# Patient Record
Sex: Female | Born: 1960 | Race: White | Hispanic: No | Marital: Married | Smoking: Former smoker
Health system: Southern US, Community
[De-identification: ages and names within clinical notes are randomized; demographics above are authoritative.]

## PROBLEM LIST (undated history)

## (undated) DIAGNOSIS — R739 Hyperglycemia, unspecified: Secondary | ICD-10-CM

## (undated) DIAGNOSIS — I341 Nonrheumatic mitral (valve) prolapse: Secondary | ICD-10-CM

## (undated) DIAGNOSIS — L409 Psoriasis, unspecified: Secondary | ICD-10-CM

## (undated) DIAGNOSIS — Z973 Presence of spectacles and contact lenses: Secondary | ICD-10-CM

## (undated) DIAGNOSIS — K589 Irritable bowel syndrome without diarrhea: Secondary | ICD-10-CM

## (undated) DIAGNOSIS — I10 Essential (primary) hypertension: Secondary | ICD-10-CM

## (undated) DIAGNOSIS — K5790 Diverticulosis of intestine, part unspecified, without perforation or abscess without bleeding: Secondary | ICD-10-CM

## (undated) DIAGNOSIS — K219 Gastro-esophageal reflux disease without esophagitis: Secondary | ICD-10-CM

## (undated) HISTORY — PX: CHOLECYSTECTOMY: SHX55

## (undated) HISTORY — PX: LAPAROSCOPIC TUBAL LIGATION: SUR803

---

## 2011-05-14 DIAGNOSIS — I10 Essential (primary) hypertension: Secondary | ICD-10-CM | POA: Insufficient documentation

## 2011-08-20 DIAGNOSIS — IMO0001 Reserved for inherently not codable concepts without codable children: Secondary | ICD-10-CM | POA: Insufficient documentation

## 2013-06-20 ENCOUNTER — Emergency Department: Payer: Self-pay | Admitting: Emergency Medicine

## 2013-06-20 LAB — COMPREHENSIVE METABOLIC PANEL
ALBUMIN: 4.1 g/dL (ref 3.4–5.0)
ALK PHOS: 132 U/L — AB
ANION GAP: 6 — AB (ref 7–16)
BUN: 18 mg/dL (ref 7–18)
Bilirubin,Total: 0.6 mg/dL (ref 0.2–1.0)
CO2: 28 mmol/L (ref 21–32)
Calcium, Total: 9.3 mg/dL (ref 8.5–10.1)
Chloride: 103 mmol/L (ref 98–107)
Creatinine: 0.91 mg/dL (ref 0.60–1.30)
EGFR (African American): >60
EGFR (Non-African Amer.): >60
Glucose: 118 mg/dL — ABNORMAL HIGH (ref 65–99)
Osmolality: 277 (ref 275–301)
Potassium: 3.7 mmol/L (ref 3.5–5.1)
SGOT(AST): 24 U/L (ref 15–37)
SGPT (ALT): 26 U/L (ref 12–78)
SODIUM: 137 mmol/L (ref 136–145)
TOTAL PROTEIN: 7.7 g/dL (ref 6.4–8.2)

## 2013-06-20 LAB — URINALYSIS, COMPLETE
Bilirubin,UR: NEGATIVE
Blood: NEGATIVE
Glucose,UR: NEGATIVE mg/dL (ref 0–75)
KETONE: NEGATIVE
Leukocyte Esterase: NEGATIVE
Nitrite: NEGATIVE
PH: 6 (ref 4.5–8.0)
Protein: NEGATIVE
SPECIFIC GRAVITY: 1.008 (ref 1.003–1.030)
Squamous Epithelial: 1
WBC UR: 1 /HPF (ref 0–5)

## 2013-06-20 LAB — CBC WITH DIFFERENTIAL/PLATELET
Basophil #: 0.1 10*3/uL (ref 0.0–0.1)
Basophil %: 0.5 %
EOS PCT: 1.7 %
Eosinophil #: 0.2 10*3/uL (ref 0.0–0.7)
HCT: 39.7 % (ref 35.0–47.0)
HGB: 13.3 g/dL (ref 12.0–16.0)
Lymphocyte #: 1.4 10*3/uL (ref 1.0–3.6)
Lymphocyte %: 13.4 %
MCH: 33.9 pg (ref 26.0–34.0)
MCHC: 33.5 g/dL (ref 32.0–36.0)
MCV: 101 fL — ABNORMAL HIGH (ref 80–100)
Monocyte #: 0.8 x10 3/mm (ref 0.2–0.9)
Monocyte %: 7.9 %
Neutrophil #: 8.2 10*3/uL — ABNORMAL HIGH (ref 1.4–6.5)
Neutrophil %: 76.5 %
Platelet: 175 10*3/uL (ref 150–440)
RBC: 3.93 10*6/uL (ref 3.80–5.20)
RDW: 13.1 % (ref 11.5–14.5)
WBC: 10.7 10*3/uL (ref 3.6–11.0)

## 2013-06-20 LAB — LIPASE, BLOOD: Lipase: 173 U/L (ref 73–393)

## 2013-06-20 LAB — TROPONIN I

## 2013-08-01 DIAGNOSIS — R87810 Cervical high risk human papillomavirus (HPV) DNA test positive: Secondary | ICD-10-CM | POA: Insufficient documentation

## 2013-08-01 DIAGNOSIS — IMO0002 Reserved for concepts with insufficient information to code with codable children: Secondary | ICD-10-CM | POA: Insufficient documentation

## 2013-08-04 DIAGNOSIS — M76899 Other specified enthesopathies of unspecified lower limb, excluding foot: Secondary | ICD-10-CM | POA: Insufficient documentation

## 2013-08-17 ENCOUNTER — Ambulatory Visit: Payer: Self-pay | Admitting: Surgery

## 2013-08-18 LAB — PATHOLOGY REPORT

## 2013-08-21 ENCOUNTER — Emergency Department: Payer: Self-pay | Admitting: Emergency Medicine

## 2013-08-21 LAB — COMPREHENSIVE METABOLIC PANEL
Albumin: 3.6 g/dL (ref 3.4–5.0)
Alkaline Phosphatase: 108 U/L
Anion Gap: 8 (ref 7–16)
BILIRUBIN TOTAL: 0.8 mg/dL (ref 0.2–1.0)
BUN: 15 mg/dL (ref 7–18)
CHLORIDE: 104 mmol/L (ref 98–107)
CO2: 27 mmol/L (ref 21–32)
Calcium, Total: 9.1 mg/dL (ref 8.5–10.1)
Creatinine: 0.66 mg/dL (ref 0.60–1.30)
EGFR (Non-African Amer.): >60
GLUCOSE: 141 mg/dL — AB (ref 65–99)
Osmolality: 281 (ref 275–301)
Potassium: 3.9 mmol/L (ref 3.5–5.1)
SGOT(AST): 27 U/L (ref 15–37)
SGPT (ALT): 43 U/L (ref 12–78)
Sodium: 139 mmol/L (ref 136–145)
TOTAL PROTEIN: 7 g/dL (ref 6.4–8.2)

## 2013-08-21 LAB — URINALYSIS, COMPLETE
BACTERIA: NONE SEEN
BILIRUBIN, UR: NEGATIVE
Glucose,UR: NEGATIVE mg/dL (ref 0–75)
Ketone: NEGATIVE
LEUKOCYTE ESTERASE: NEGATIVE
Nitrite: NEGATIVE
PH: 6 (ref 4.5–8.0)
Protein: NEGATIVE
RBC,UR: 64 /HPF (ref 0–5)
SPECIFIC GRAVITY: 1.014 (ref 1.003–1.030)
SQUAMOUS EPITHELIAL: NONE SEEN

## 2013-08-21 LAB — CBC WITH DIFFERENTIAL/PLATELET
Basophil #: 0 10*3/uL (ref 0.0–0.1)
Basophil %: 0.4 %
EOS PCT: 1.1 %
Eosinophil #: 0.1 10*3/uL (ref 0.0–0.7)
HCT: 44.1 % (ref 35.0–47.0)
HGB: 14.3 g/dL (ref 12.0–16.0)
LYMPHS PCT: 18.5 %
Lymphocyte #: 1.2 10*3/uL (ref 1.0–3.6)
MCH: 33.1 pg (ref 26.0–34.0)
MCHC: 32.4 g/dL (ref 32.0–36.0)
MCV: 102 fL — ABNORMAL HIGH (ref 80–100)
MONO ABS: 0.5 x10 3/mm (ref 0.2–0.9)
MONOS PCT: 8 %
Neutrophil #: 4.7 10*3/uL (ref 1.4–6.5)
Neutrophil %: 72 %
Platelet: 221 10*3/uL (ref 150–440)
RBC: 4.32 10*6/uL (ref 3.80–5.20)
RDW: 13.1 % (ref 11.5–14.5)
WBC: 6.6 10*3/uL (ref 3.6–11.0)

## 2013-08-21 LAB — PREGNANCY, URINE: Pregnancy Test, Urine: NEGATIVE m[IU]/mL

## 2014-07-08 NOTE — Op Note (Signed)
PATIENT NAME:  Tina Hurst, Tina Hurst MR#:  [id number] DATE OF BIRTH:  May 28, 1960  DATE OF PROCEDURE:  08/17/2013  ATTENDING SURGEON: Harrell Gave A. Veron Senner, MD  PREOPERATIVE DIAGNOSIS: Symptomatic cholelithiasis.   POSTOPERATIVE DIAGNOSIS: Symptomatic cholelithiasis.  PROCEDURE PERFORMED: Da Vinci robot-assisted laparoscopic cholecystectomy.   ESTIMATED BLOOD LOSS: 25 mL.   COMPLICATIONS: None.   SPECIMENS: Gallbladder.   INDICATION FOR SURGERY: [name] is a pleasant 54 year old female with gallstones and recurrent right upper quadrant pain which is biliary in nature. She is thus brought to the operating room for cholecystectomy.   DETAILS OF PROCEDURE: As follows: Informed consent was obtained. [name] was brought to the operating room suite. She was induced, endotracheal tube was placed, general anesthesia was administered. Her abdomen was then prepped and draped in standard surgical fashion. A timeout was then performed, correctly identifying the patient name, operative site and procedure to be performed. An infraumbilical lengthwise incision was made. This was deepened down to the fascia. The fascia was incised. The peritoneum was entered. Two stay sutures were placed through the fasciotomy. A balloon tipped Hasson trocar was placed through the fasciotomy. Two 8 mm da Vinci robot trocars were placed approximately 8 cm to the patient's left and anterior as well as to the patient's right and mildly anterior. A 5 mm assistant port was placed at the right anterior axillary line. The gallbladder was then lifted over the dome of the liver, and after robot was docked, the cystic artery and cystic duct were dissected out and clipped and ligated. There was a second structure that looked like a posterior artery, but may have been a band of fibrinous tissue, which was also clipped and ligated. The gallbladder was then taken off the gallbladder fossa using cautery. It was then brought out through an  Endo Catch bag through the infraumbilical port site. The wound was then irrigated. Everything was noted to be hemostatic. The robot was undocked, and all trocars were taken out. The wounds were infiltrated with 1% lidocaine with epinephrine. The infraumbilical fascia was closed with a figure-of-eight 0 Vicryl. All skin sites were closed with interrupted 4-0 Monocryl deep dermal sutures. Steri-Strips, Telfa gauze and Tegaderm were used to complete the wounds. The patient was then awoken, extubated and brought to the postanesthesia care unit. There were no immediate complications. Needle, sponge and instrument counts were correct at the end of the procedure.   ____________________________ Glena Norfolk. Niley Helbig, MD cal:lb D: 08/18/2013 10:34:50 ET T: 08/18/2013 11:12:42 ET JOB#: [id number]  cc: Harrell Gave A. Samanatha Brammer, MD, <Dictator> Floyde Parkins MD ELECTRONICALLY SIGNED 08/18/2013 18:06

## 2014-10-29 ENCOUNTER — Emergency Department: Payer: BLUE CROSS/BLUE SHIELD

## 2014-10-29 ENCOUNTER — Emergency Department
Admission: EM | Admit: 2014-10-29 | Discharge: 2014-10-29 | Disposition: A | Discharge status: Home / Self Care | Payer: BLUE CROSS/BLUE SHIELD | Source: Home / Self Care | Attending: Emergency Medicine | Admitting: Emergency Medicine

## 2014-10-29 ENCOUNTER — Encounter: Payer: Self-pay | Admitting: Radiology

## 2014-10-29 DIAGNOSIS — K5732 Diverticulitis of large intestine without perforation or abscess without bleeding: Secondary | ICD-10-CM

## 2014-10-29 DIAGNOSIS — K5792 Diverticulitis of intestine, part unspecified, without perforation or abscess without bleeding: Secondary | ICD-10-CM | POA: Diagnosis not present

## 2014-10-29 DIAGNOSIS — R1032 Left lower quadrant pain: Secondary | ICD-10-CM

## 2014-10-29 HISTORY — DX: Essential (primary) hypertension: I10

## 2014-10-29 LAB — COMPREHENSIVE METABOLIC PANEL
ALT: 17 U/L (ref 14–54)
AST: 22 U/L (ref 15–41)
Albumin: 4.2 g/dL (ref 3.5–5.0)
Alkaline Phosphatase: 97 U/L (ref 38–126)
Anion gap: 10 (ref 5–15)
BUN: 18 mg/dL (ref 6–20)
CALCIUM: 8.9 mg/dL (ref 8.9–10.3)
CHLORIDE: 100 mmol/L — AB (ref 101–111)
CO2: 27 mmol/L (ref 22–32)
Creatinine, Ser: 0.85 mg/dL (ref 0.44–1.00)
GFR calc Af Amer: >60 mL/min (ref >60)
GFR calc non Af Amer: >60 mL/min (ref >60)
Glucose, Bld: 134 mg/dL — ABNORMAL HIGH (ref 65–99)
Potassium: 3.6 mmol/L (ref 3.5–5.1)
SODIUM: 137 mmol/L (ref 135–145)
TOTAL PROTEIN: 7.4 g/dL (ref 6.5–8.1)
Total Bilirubin: 1 mg/dL (ref 0.3–1.2)

## 2014-10-29 LAB — CBC WITH DIFFERENTIAL/PLATELET
BASOS ABS: 0 10*3/uL (ref 0–0.1)
BASOS PCT: 0 %
EOS PCT: 2 %
Eosinophils Absolute: 0.2 10*3/uL (ref 0–0.7)
HCT: 39.3 % (ref 35.0–47.0)
Hemoglobin: 13.3 g/dL (ref 12.0–16.0)
LYMPHS PCT: 11 %
Lymphs Abs: 1.1 10*3/uL (ref 1.0–3.6)
MCH: 33.5 pg (ref 26.0–34.0)
MCHC: 33.8 g/dL (ref 32.0–36.0)
MCV: 99 fL (ref 80.0–100.0)
MONOS PCT: 9 %
Monocytes Absolute: 0.9 10*3/uL (ref 0.2–0.9)
NEUTROS PCT: 78 %
Neutro Abs: 7.7 10*3/uL — ABNORMAL HIGH (ref 1.4–6.5)
PLATELETS: 165 10*3/uL (ref 150–440)
RBC: 3.97 MIL/uL (ref 3.80–5.20)
RDW: 13 % (ref 11.5–14.5)
WBC: 9.9 10*3/uL (ref 3.6–11.0)

## 2014-10-29 LAB — URINALYSIS COMPLETE WITH MICROSCOPIC (ARMC ONLY)
BILIRUBIN URINE: NEGATIVE
GLUCOSE, UA: NEGATIVE mg/dL
HGB URINE DIPSTICK: NEGATIVE
Ketones, ur: NEGATIVE mg/dL
Leukocytes, UA: NEGATIVE
Nitrite: POSITIVE — AB
PROTEIN: NEGATIVE mg/dL
SPECIFIC GRAVITY, URINE: 1.017 (ref 1.005–1.030)
pH: 6 (ref 5.0–8.0)

## 2014-10-29 LAB — LIPASE, BLOOD: LIPASE: 61 U/L — AB (ref 22–51)

## 2014-10-29 MED ORDER — CIPROFLOXACIN HCL 500 MG PO TABS
500.0000 mg | ORAL_TABLET | Freq: Once | ORAL | Status: AC
  Administered 2014-10-29: 500 mg via ORAL
  Filled 2014-10-29: qty 1

## 2014-10-29 MED ORDER — METRONIDAZOLE 500 MG PO TABS
500.0000 mg | ORAL_TABLET | Freq: Once | ORAL | Status: AC
  Administered 2014-10-29: 500 mg via ORAL
  Filled 2014-10-29: qty 1

## 2014-10-29 MED ORDER — CIPROFLOXACIN HCL 500 MG PO TABS
500.0000 mg | ORAL_TABLET | Freq: Two times a day (BID) | ORAL | Status: AC
Start: 2014-10-29 — End: 2014-11-08

## 2014-10-29 MED ORDER — ONDANSETRON HCL 4 MG/2ML IJ SOLN
4.0000 mg | Freq: Once | INTRAMUSCULAR | Status: AC
  Administered 2014-10-29: 4 mg via INTRAVENOUS
  Filled 2014-10-29: qty 2

## 2014-10-29 MED ORDER — IOHEXOL 300 MG/ML  SOLN
100.0000 mL | Freq: Once | INTRAMUSCULAR | Status: AC | PRN
  Administered 2014-10-29: 100 mL via INTRAVENOUS

## 2014-10-29 MED ORDER — OXYCODONE-ACETAMINOPHEN 5-325 MG PO TABS: 1.0000 | ORAL_TABLET | Freq: Four times a day (QID) | ORAL | Status: DC | PRN

## 2014-10-29 MED ORDER — MORPHINE SULFATE 4 MG/ML IJ SOLN
4.0000 mg | Freq: Once | INTRAMUSCULAR | Status: AC
Start: 2014-10-29 — End: 2014-10-29
  Administered 2014-10-29: 4 mg via INTRAVENOUS
  Filled 2014-10-29: qty 1

## 2014-10-29 MED ORDER — IOHEXOL 240 MG/ML SOLN
25.0000 mL | Freq: Once | INTRAMUSCULAR | Status: AC | PRN
  Administered 2014-10-29: 25 mL via ORAL

## 2014-10-29 MED ORDER — OXYCODONE-ACETAMINOPHEN 5-325 MG PO TABS
2.0000 | ORAL_TABLET | Freq: Once | ORAL | Status: AC
  Administered 2014-10-29: 2 via ORAL
  Filled 2014-10-29: qty 2

## 2014-10-29 MED ORDER — METRONIDAZOLE 500 MG PO TABS: 500.0000 mg | ORAL_TABLET | Freq: Three times a day (TID) | ORAL | Status: DC

## 2014-10-29 NOTE — ED Provider Notes (Signed)
[facility] Emergency Department Provider Note     Time seen: ----------------------------------------- 7:03 AM on 10/29/2014 -----------------------------------------    I have reviewed the triage vital signs and the nursing notes.   HISTORY  Chief Complaint Abdominal Pain    HPI Tina Hurst is a 54 y.o. female who presents ER with acute left lower quadrant pain since yesterday. She states she's gotten progressively worse. She has not had a history of this before, movement makes it worse, did not have a bowel movement yesterday. Denies any nausea or vomiting or urinary symptoms. May have had a fever, but no chills   No past medical history on file.  There are no active problems to display for this patient.   No past surgical history on file.  Allergies Augmentin and Sulfa antibiotics  Social History Social History  Substance Use Topics  . Smoking status: Not on file  . Smokeless tobacco: Not on file  . Alcohol Use: Not on file    Review of Systems Constitutional: Positive for fever Eyes: Negative for visual changes. ENT: Negative for sore throat. Cardiovascular: Negative for chest pain. Respiratory: Negative for shortness of breath. Gastrointestinal: Positive for left lower quadrant pain Genitourinary: Negative for dysuria. Musculoskeletal: Negative for back pain. Skin: Negative for rash. Neurological: Negative for headaches, focal weakness or numbness.  10-point ROS otherwise negative.  ____________________________________________   PHYSICAL EXAM:  VITAL SIGNS: ED Triage Vitals  Enc Vitals Group     BP 10/29/14 0622 131/81 mmHg     Pulse Rate 10/29/14 0622 108     Resp 10/29/14 0622 20     Temp 10/29/14 0622 98.4 F (36.9 C)     Temp Source 10/29/14 0622 Oral     SpO2 10/29/14 0622 100 %     Weight 10/29/14 0622 230 lb (104.327 kg)     Height 10/29/14 0622 5\' 10"  (1.778 m)     Head Cir --      Peak Flow --    Pain Score 10/29/14 0623 2     Pain Loc --      Pain Edu? --      Excl. in [place]? --     Constitutional: Alert and oriented. Well appearing and in no distress. Eyes: Conjunctivae are normal. PERRL. Normal extraocular movements. ENT   Head: Normocephalic and atraumatic.   Nose: No congestion/rhinnorhea.   Mouth/Throat: Mucous membranes are moist.   Neck: No stridor. Cardiovascular: Normal rate, regular rhythm. Normal and symmetric distal pulses are present in all extremities. No murmurs, rubs, or gallops. Respiratory: Normal respiratory effort without tachypnea nor retractions. Breath sounds are clear and equal bilaterally. No wheezes/rales/rhonchi. Gastrointestinal: Significant left lower quadrant tenderness, no rebound or guarding. Normal bowel sounds. Musculoskeletal: Nontender with normal range of motion in all extremities. No joint effusions.  No lower extremity tenderness nor edema. Neurologic:  Normal speech and language. No gross focal neurologic deficits are appreciated. Speech is normal. No gait instability. Skin:  Skin is warm, dry and intact. No rash noted. Psychiatric: Mood and affect are normal. Speech and behavior are normal. Patient exhibits appropriate insight and judgment.  ____________________________________________  ED COURSE:  Pertinent labs & imaging results that were available during my care of the patient were reviewed by me and considered in my medical decision making (see chart for details). Patient will need imaging, likely diverticulitis. Morphine will be given for pain ____________________________________________    LABS (pertinent positives/negatives)  Labs Reviewed  CBC WITH DIFFERENTIAL/PLATELET - Abnormal; Notable  for the following:    Neutro Abs 7.7 (*)    All other components within normal limits  COMPREHENSIVE METABOLIC PANEL - Abnormal; Notable for the following:    Chloride 100 (*)    Glucose, Bld 134 (*)    All other components  within normal limits  LIPASE, BLOOD - Abnormal; Notable for the following:    Lipase 61 (*)    All other components within normal limits  URINALYSIS COMPLETEWITH MICROSCOPIC ([facility] ONLY) - Abnormal; Notable for the following:    Color, Urine YELLOW (*)    APPearance HAZY (*)    Nitrite POSITIVE (*)    Bacteria, UA RARE (*)    Squamous Epithelial / LPF 0-5 (*)    All other components within normal limits    RADIOLOGY Images were viewed by me  CT abdomen and pelvis with contrast IMPRESSION: 1. Findings, as above, compatible with acute diverticulitis of the descending colon. No signs of diverticular abscess or frank perforation at this time. 2. Given the extensive colonic wall thickening in this area, the possibility of concurrent neoplasm is not excluded. Although not strongly favored, followup nonemergent colonoscopy is recommended in the near future after resolution of the patient's acute diverticulitis to better evaluate this area and exclude the possibility of concurrent neoplasm. 3. Moderate to large hiatal hernia. 4. Atherosclerosis. 5. Additional incidental findings, as above. ____________________________________________  FINAL ASSESSMENT AND PLAN  Diverticulitis  Plan: Patient with labs and imaging as dictated above. Patient being treated here with Cipro and Flagyl for diverticulitis. Will be discharged with Cipro Flagyl and Percocet for pain. She is advised to follow-up with her doctor in next 2-3 days if not improving.   Earleen Newport, MD   Earleen Newport, MD 10/29/14 [phone]

## 2014-10-29 NOTE — ED Notes (Signed)
Reports left lower quad pain since Saturday that has gotten progressive worse.  Denies nausea, vomiting diarrhea or urinary symptoms.

## 2014-10-29 NOTE — Discharge Instructions (Signed)

## 2014-10-31 ENCOUNTER — Telehealth: Payer: Self-pay | Admitting: Gastroenterology

## 2014-10-31 NOTE — Telephone Encounter (Signed)
Patient ended up in the ED this week and has questions regarding medications. She was diagnosed with diverticulitis. Please call today.

## 2014-11-01 ENCOUNTER — Inpatient Hospital Stay
Admission: EM | Admit: 2014-11-01 | Discharge: 2014-11-03 | DRG: 392 | Disposition: A | Discharge status: Home / Self Care | Payer: BLUE CROSS/BLUE SHIELD | Attending: Internal Medicine | Admitting: Internal Medicine

## 2014-11-01 ENCOUNTER — Encounter: Payer: Self-pay | Admitting: *Deleted

## 2014-11-01 DIAGNOSIS — R739 Hyperglycemia, unspecified: Secondary | ICD-10-CM | POA: Diagnosis present

## 2014-11-01 DIAGNOSIS — Z9851 Tubal ligation status: Secondary | ICD-10-CM | POA: Diagnosis not present

## 2014-11-01 DIAGNOSIS — Z8249 Family history of ischemic heart disease and other diseases of the circulatory system: Secondary | ICD-10-CM | POA: Diagnosis not present

## 2014-11-01 DIAGNOSIS — Z888 Allergy status to other drugs, medicaments and biological substances status: Secondary | ICD-10-CM | POA: Diagnosis not present

## 2014-11-01 DIAGNOSIS — Z79899 Other long term (current) drug therapy: Secondary | ICD-10-CM

## 2014-11-01 DIAGNOSIS — Z833 Family history of diabetes mellitus: Secondary | ICD-10-CM

## 2014-11-01 DIAGNOSIS — E876 Hypokalemia: Secondary | ICD-10-CM | POA: Diagnosis present

## 2014-11-01 DIAGNOSIS — Z811 Family history of alcohol abuse and dependence: Secondary | ICD-10-CM | POA: Diagnosis not present

## 2014-11-01 DIAGNOSIS — Z79891 Long term (current) use of opiate analgesic: Secondary | ICD-10-CM | POA: Diagnosis not present

## 2014-11-01 DIAGNOSIS — K589 Irritable bowel syndrome without diarrhea: Secondary | ICD-10-CM | POA: Diagnosis present

## 2014-11-01 DIAGNOSIS — I10 Essential (primary) hypertension: Secondary | ICD-10-CM | POA: Diagnosis present

## 2014-11-01 DIAGNOSIS — Z87891 Personal history of nicotine dependence: Secondary | ICD-10-CM | POA: Diagnosis not present

## 2014-11-01 DIAGNOSIS — Z882 Allergy status to sulfonamides status: Secondary | ICD-10-CM

## 2014-11-01 DIAGNOSIS — K5792 Diverticulitis of intestine, part unspecified, without perforation or abscess without bleeding: Secondary | ICD-10-CM | POA: Diagnosis present

## 2014-11-01 DIAGNOSIS — K5732 Diverticulitis of large intestine without perforation or abscess without bleeding: Principal | ICD-10-CM | POA: Diagnosis present

## 2014-11-01 HISTORY — DX: Diverticulosis of intestine, part unspecified, without perforation or abscess without bleeding: K57.90

## 2014-11-01 HISTORY — DX: Hyperglycemia, unspecified: R73.9

## 2014-11-01 HISTORY — DX: Irritable bowel syndrome, unspecified: K58.9

## 2014-11-01 HISTORY — DX: Psoriasis, unspecified: L40.9

## 2014-11-01 HISTORY — DX: Nonrheumatic mitral (valve) prolapse: I34.1

## 2014-11-01 LAB — COMPREHENSIVE METABOLIC PANEL
ALBUMIN: 4.5 g/dL (ref 3.5–5.0)
ALT: 42 U/L (ref 14–54)
ANION GAP: 10 (ref 5–15)
AST: 51 U/L — ABNORMAL HIGH (ref 15–41)
Alkaline Phosphatase: 106 U/L (ref 38–126)
BUN: 12 mg/dL (ref 6–20)
CO2: 26 mmol/L (ref 22–32)
Calcium: 9.3 mg/dL (ref 8.9–10.3)
Chloride: 97 mmol/L — ABNORMAL LOW (ref 101–111)
Creatinine, Ser: 0.81 mg/dL (ref 0.44–1.00)
GFR calc Af Amer: >60 mL/min (ref >60)
GFR calc non Af Amer: >60 mL/min (ref >60)
GLUCOSE: 99 mg/dL (ref 65–99)
POTASSIUM: 3.5 mmol/L (ref 3.5–5.1)
SODIUM: 133 mmol/L — AB (ref 135–145)
TOTAL PROTEIN: 7.8 g/dL (ref 6.5–8.1)
Total Bilirubin: 0.5 mg/dL (ref 0.3–1.2)

## 2014-11-01 LAB — URINALYSIS COMPLETE WITH MICROSCOPIC (ARMC ONLY)
BILIRUBIN URINE: NEGATIVE
Glucose, UA: NEGATIVE mg/dL
HGB URINE DIPSTICK: NEGATIVE
Ketones, ur: NEGATIVE mg/dL
NITRITE: NEGATIVE
PH: 6 (ref 5.0–8.0)
Protein, ur: NEGATIVE mg/dL
SPECIFIC GRAVITY, URINE: 1.013 (ref 1.005–1.030)

## 2014-11-01 LAB — CBC WITH DIFFERENTIAL/PLATELET
BASOS PCT: 0 %
Basophils Absolute: 0 10*3/uL (ref 0–0.1)
EOS ABS: 0.2 10*3/uL (ref 0–0.7)
EOS PCT: 3 %
HCT: 42.9 % (ref 35.0–47.0)
Hemoglobin: 14.3 g/dL (ref 12.0–16.0)
Lymphocytes Relative: 23 %
Lymphs Abs: 1.5 10*3/uL (ref 1.0–3.6)
MCH: 33.2 pg (ref 26.0–34.0)
MCHC: 33.4 g/dL (ref 32.0–36.0)
MCV: 99.6 fL (ref 80.0–100.0)
MONO ABS: 0.7 10*3/uL (ref 0.2–0.9)
MONOS PCT: 11 %
Neutro Abs: 4.1 10*3/uL (ref 1.4–6.5)
Neutrophils Relative %: 63 %
Platelets: 256 10*3/uL (ref 150–440)
RBC: 4.31 MIL/uL (ref 3.80–5.20)
RDW: 12.8 % (ref 11.5–14.5)
WBC: 6.6 10*3/uL (ref 3.6–11.0)

## 2014-11-01 MED ORDER — HYDROMORPHONE HCL 1 MG/ML IJ SOLN
0.5000 mg | Freq: Once | INTRAMUSCULAR | Status: AC
  Administered 2014-11-02: 0.5 mg via INTRAVENOUS
  Filled 2014-11-01: qty 1

## 2014-11-01 MED ORDER — SODIUM CHLORIDE 0.9 % IV SOLN
INTRAVENOUS | Status: AC
  Administered 2014-11-01: 23:00:00 via INTRAVENOUS

## 2014-11-01 MED ORDER — PIPERACILLIN-TAZOBACTAM 3.375 G IVPB
3.3750 g | Freq: Once | INTRAVENOUS | Status: DC
  Administered 2014-11-01: 3.375 g via INTRAVENOUS

## 2014-11-01 MED ORDER — PIPERACILLIN-TAZOBACTAM 3.375 G IVPB
3.3750 g | Freq: Three times a day (TID) | INTRAVENOUS | Status: DC
  Administered 2014-11-02 – 2014-11-03 (×4): 3.375 g via INTRAVENOUS
  Filled 2014-11-01 (×9): qty 50

## 2014-11-01 MED ORDER — PIPERACILLIN SOD-TAZOBACTAM SO 3.375 (3-0.375) G IV SOLR
INTRAVENOUS | Status: AC
  Filled 2014-11-01: qty 3.38

## 2014-11-01 MED ORDER — SODIUM CHLORIDE 0.9 % IV BOLUS (SEPSIS)
1000.0000 mL | Freq: Once | INTRAVENOUS | Status: AC
Start: 2014-11-01 — End: 2014-11-01
  Administered 2014-11-01: 1000 mL via INTRAVENOUS

## 2014-11-01 MED ORDER — ONDANSETRON HCL 4 MG/2ML IJ SOLN
4.0000 mg | Freq: Four times a day (QID) | INTRAMUSCULAR | Status: DC | PRN
  Administered 2014-11-01: 4 mg via INTRAVENOUS
  Filled 2014-11-01: qty 2

## 2014-11-01 MED ORDER — PIPERACILLIN-TAZOBACTAM 3.375 G IVPB
3.3750 g | Freq: Three times a day (TID) | INTRAVENOUS | Status: DC
  Filled 2014-11-01 (×2): qty 50

## 2014-11-01 MED ORDER — ENOXAPARIN SODIUM 40 MG/0.4ML ~~LOC~~ SOLN
40.0000 mg | SUBCUTANEOUS | Status: DC
  Administered 2014-11-02 – 2014-11-03 (×2): 40 mg via SUBCUTANEOUS
  Filled 2014-11-01 (×2): qty 0.4

## 2014-11-01 MED ORDER — MORPHINE SULFATE (PF) 4 MG/ML IV SOLN
4.0000 mg | INTRAVENOUS | Status: DC | PRN
  Administered 2014-11-01 – 2014-11-02 (×2): 4 mg via INTRAVENOUS
  Filled 2014-11-01 (×2): qty 1

## 2014-11-01 NOTE — Telephone Encounter (Signed)
Called patient once again. No answer. Left voicemail for return phone call. Will be glad to speak with patient if she calls back in.

## 2014-11-01 NOTE — Progress Notes (Signed)
ANTIBIOTIC CONSULT NOTE - INITIAL  Pharmacy Consult for Zosyn Indication: IAI  Allergies  Allergen Reactions  . Augmentin [Amoxicillin-Pot Clavulanate] Nausea And Vomiting and Other (See Comments)    Pt states that she is able to take 250mg  tablet.    . Amlodipine Swelling and Palpitations  . Sulfa Antibiotics Rash    Patient Measurements: Height: 5\' 10"  (177.8 cm) Weight: 230 lb (104.327 kg) IBW/kg (Calculated) : 68.5 Adjusted Body Weight: 82 kg  Vital Signs: Temp: 98 F (36.7 C) (08/17 1741) Temp Source: Oral (08/17 1741) BP: 140/69 mmHg (08/17 2241) Pulse Rate: 75 (08/17 2241) Intake/Output from previous day:   Intake/Output from this shift:    Labs:  Recent Labs  11/01/14 1745  WBC 6.6  HGB 14.3  PLT 256  CREATININE 0.81   Estimated Creatinine Clearance: 101.4 mL/min (by C-G formula based on Cr of 0.81). No results for input(s): VANCOTROUGH, VANCOPEAK, VANCORANDOM, GENTTROUGH, GENTPEAK, GENTRANDOM, TOBRATROUGH, TOBRAPEAK, TOBRARND, AMIKACINPEAK, AMIKACINTROU, AMIKACIN in the last 72 hours.   Microbiology: No results found for this or any previous visit (from the past 720 hour(s)).  Medical History: Past Medical History  Diagnosis Date  . Hypertension   . IBS (irritable bowel syndrome)   . Psoriasis   . Diverticulosis   . Mitral valve prolapse   . Hyperglycemia     Medications:  Infusions:  . sodium chloride     Assessment: 54 yof cc abdominal pain recently took Flagyl/Cipro x 3 days for diverticulitis. Continues to have pain/no improvement so represents to ED and starting Zosyn.  Goal of Therapy:  Resolution of abdominal pain, normalization of imaging  Plan:  Expected duration 7 days with resolution of temperature and/or normalization of WBC. Zosyn 3.375 gm IV Q8H EI.   Laural Benes, Pharm.D.  Clinical Pharmacist 11/01/2014,11:00 PM

## 2014-11-01 NOTE — H&P (Signed)
[facility] [place] NAME: Tina Hurst    MR#:  [id number]  DATE OF BIRTH:  20-Apr-1960  DATE OF ADMISSION:  11/01/2014  PRIMARY CARE PHYSICIAN: Hortencia Pilar, MD   REQUESTING/REFERRING PHYSICIAN: Archie Balboa, M.D.  CHIEF COMPLAINT:   Chief Complaint  Patient presents with  . Abdominal Pain    HISTORY OF PRESENT ILLNESS:  Tina Hurst  is a 54 y.o. female who presents with left lower abdominal pain with nausea and vomiting. This initially started 4-5 days ago, with left lower quadrant pain, and then diarrhea and nausea. The pain progressed and is oftentimes severe. Patient was seen for this 3 days ago and was diagnosed with diverticulitis, and was placed on outpatient oral antibiotics. Despite taking his oral antibiotics, her condition is not improved. She denies any blood in her stool. CT of her abdomen tonight in the ED is consistent with diverticulitis. Hospitalists were called for admission for IV antibiotics for diverticulitis in the setting of having failed outpatient therapy.  PAST MEDICAL HISTORY:   Past Medical History  Diagnosis Date  . Hypertension   . IBS (irritable bowel syndrome)   . Psoriasis   . Diverticulosis   . Mitral valve prolapse   . Hyperglycemia     PAST SURGICAL HISTORY:   Past Surgical History  Procedure Laterality Date  . Laparoscopic tubal ligation      SOCIAL HISTORY:   Social History  Substance Use Topics  . Smoking status: Former Research scientist (life sciences)  . Smokeless tobacco: Not on file  . Alcohol Use: 0.0 oz/week    0 Standard drinks or equivalent per week     Comment: occassional - 4 drinks a week    FAMILY HISTORY:   Family History  Problem Relation Age of Onset  . Irritable bowel syndrome Sister   . Hypertension Mother   . Diabetes Mother   . Hypertension Father   . Alcohol abuse Father     DRUG ALLERGIES:   Allergies  Allergen Reactions  . Augmentin [Amoxicillin-Pot Clavulanate]  Nausea And Vomiting and Other (See Comments)    Pt states that she is able to take 250mg  tablet.    . Amlodipine Swelling and Palpitations  . Sulfa Antibiotics Rash    MEDICATIONS AT HOME:   Prior to Admission medications   Medication Sig Start Date End Date Taking? Authorizing Provider  alosetron (LOTRONEX) 0.5 MG tablet Take 0.25 mg by mouth daily.   Yes Historical Provider, MD  Calcium Carb-Cholecalciferol (CALCIUM 600 + D PO) Take 1 tablet by mouth daily.    Yes Historical Provider, MD  carvedilol (COREG) 25 MG tablet Take 25 mg by mouth 2 (two) times daily.   Yes Historical Provider, MD  Chromium Picolinate (CHROMIUM PICOLATE) 1000 MCG TABS Take 1,000 mcg by mouth daily.   Yes Historical Provider, MD  ciprofloxacin (CIPRO) 500 MG tablet Take 1 tablet (500 mg total) by mouth 2 (two) times daily. 10/29/14 11/08/14 Yes Earleen Newport, MD  hydrochlorothiazide (HYDRODIURIL) 12.5 MG tablet Take 12.5 mg by mouth daily.   Yes Historical Provider, MD  losartan (COZAAR) 100 MG tablet Take 100 mg by mouth daily.   Yes Historical Provider, MD  metroNIDAZOLE (FLAGYL) 500 MG tablet Take 1 tablet (500 mg total) by mouth 3 (three) times daily. 10/29/14  Yes Earleen Newport, MD  Multiple Vitamin (MULTIVITAMIN WITH MINERALS) TABS tablet Take 1 tablet by mouth daily.   Yes Historical Provider, MD  oxyCODONE-acetaminophen (ROXICET) 5-325  MG per tablet Take 1 tablet by mouth every 6 (six) hours as needed. Patient taking differently: Take 1 tablet by mouth every 6 (six) hours as needed for severe pain.  10/29/14  Yes Earleen Newport, MD  PRESCRIPTION MEDICATION Take 1 capsule by mouth daily. Pt takes a compounded estrogen capsule.  Biestrogen (80/20) and Progesterone 0.4mg /50mg .   Yes Historical Provider, MD    REVIEW OF SYSTEMS:  Review of Systems  Constitutional: Positive for fever (subjective, at home) and malaise/fatigue. Negative for chills and weight loss.  HENT: Negative for ear pain,  hearing loss and tinnitus.   Eyes: Negative for blurred vision, double vision, pain and redness.  Respiratory: Negative for cough, hemoptysis and shortness of breath.   Cardiovascular: Negative for chest pain, palpitations, orthopnea and leg swelling.  Gastrointestinal: Positive for nausea, abdominal pain and diarrhea. Negative for vomiting, constipation and blood in stool.  Genitourinary: Negative for dysuria, frequency and hematuria.  Musculoskeletal: Negative for back pain, joint pain and neck pain.  Skin:       No acne, rash, or lesions  Neurological: Negative for dizziness, tremors, focal weakness and weakness.  Endo/Heme/Allergies: Negative for polydipsia. Does not bruise/bleed easily.  Psychiatric/Behavioral: Negative for depression. The patient is not nervous/anxious and does not have insomnia.      VITAL SIGNS:   Filed Vitals:   11/01/14 1741  BP: 102/81  Pulse: 96  Temp: 98 F (36.7 C)  TempSrc: Oral  Resp: 20  Height: 5\' 10"  (1.778 m)  Weight: 104.327 kg (230 lb)  SpO2: 99%   Wt Readings from Last 3 Encounters:  11/01/14 104.327 kg (230 lb)  10/29/14 104.327 kg (230 lb)    PHYSICAL EXAMINATION:  Physical Exam  Vitals reviewed. Constitutional: She is oriented to person, place, and time. She appears well-developed and well-nourished. No distress.  HENT:  Head: Normocephalic and atraumatic.  Mouth/Throat: Oropharynx is clear and moist.  Eyes: Conjunctivae and EOM are normal. Pupils are equal, round, and reactive to light. No scleral icterus.  Neck: Normal range of motion. Neck supple. No JVD present. No thyromegaly present.  Cardiovascular: Normal rate, regular rhythm and intact distal pulses.  Exam reveals no gallop and no friction rub.   No murmur heard. Respiratory: Effort normal and breath sounds normal. No respiratory distress. She has no wheezes. She has no rales.  GI: Soft. Bowel sounds are normal. She exhibits no distension. There is tenderness (Worst left  lower quadrant).  Musculoskeletal: Normal range of motion. She exhibits no edema.  No arthritis, no gout  Lymphadenopathy:    She has no cervical adenopathy.  Neurological: She is alert and oriented to person, place, and time. No cranial nerve deficit.  No dysarthria, no aphasia  Skin: Skin is warm and dry. No rash noted. No erythema.  Psychiatric: She has a normal mood and affect. Her behavior is normal. Judgment and thought content normal.    LABORATORY PANEL:   CBC  Recent Labs Lab 11/01/14 1745  WBC 6.6  HGB 14.3  HCT 42.9  PLT 256   ------------------------------------------------------------------------------------------------------------------  Chemistries   Recent Labs Lab 11/01/14 1745  NA 133*  K 3.5  CL 97*  CO2 26  GLUCOSE 99  BUN 12  CREATININE 0.81  CALCIUM 9.3  AST 51*  ALT 42  ALKPHOS 106  BILITOT 0.5   ------------------------------------------------------------------------------------------------------------------  Cardiac Enzymes No results for input(s): TROPONINI in the last 168 hours. ------------------------------------------------------------------------------------------------------------------  RADIOLOGY:  No results found.  EKG:   Orders  placed or performed in visit on 06/20/13  . EKG 12-Lead    IMPRESSION AND PLAN:  Principal Problem:   Diverticulitis - patient placed on Zosyn in the ED, we will continue this medication while inpatient. Plan to scale her back to oral antibiotics once her pain and symptoms improve. Gentle IV fluids for now, keep her nothing by mouth for now. Active Problems:   HTN (hypertension) - hold home medications for this right now, as her blood pressure is not elevated.   IBS (irritable bowel syndrome) - continue home medications   Hyperglycemia - glucose has been stable so far, we will start with every 12 hours fingersticks, to be discontinued if they remain normal  All the records are reviewed and  case discussed with ED provider. Management plans discussed with the patient and/or family.  DVT PROPHYLAXIS: SubQ lovenox  ADMISSION STATUS: Inpatient  CODE STATUS: Full  TOTAL TIME TAKING CARE OF THIS PATIENT: 45 minutes.    Jaylon Grode FIELDING 11/01/2014, 9:55 PM  [facility]  Office  [phone]  CC: Primary care physician; Hortencia Pilar, MD

## 2014-11-01 NOTE — Telephone Encounter (Signed)
Returned phone call to patient at this time. No answer. Left voicemail for patient to return phone call.

## 2014-11-01 NOTE — ED Notes (Signed)
Pt has left lower abd pain.  Pt sent from pmd for eval.  Pt was seen in er 3 days and was dx with diverticulitis.  Pt states pain is no better.

## 2014-11-01 NOTE — ED Notes (Signed)
Pt here with LLQ pain, pt was here on Sunday and seen for the same. Pt was given Po med's and sent home. Pt f/u with her PCP today and was not getting any better, PCP sent pt in for re-eval. Pt denies N/V. Pt states that she has not been able to eat or drink a lot.  Pt in NAD at this time will cont to monitor pt at all times.

## 2014-11-01 NOTE — ED Provider Notes (Signed)
[facility] Emergency Department Provider Note   ____________________________________________  Time seen: 1955  I have reviewed the triage vital signs and the nursing notes.   HISTORY  Chief Complaint Abdominal Pain   History limited by: Not Limited   HPI Tina Hurst is a 54 y.o. female who presents to the emergency department today because of continued abdominal pain. The patient was seen in the emergency department 3 days ago for similar abdominal pain and was diagnosed diverticulitis on CT scan. Patient states she has been taking Flagyl and Cipro for 3 days. States she feels like it has not improved her pain at all. She went to see her primary care doctor today for a follow-up and the recommended she get reevaluated here in the emergency department.   Past Medical History  Diagnosis Date  . Hypertension     There are no active problems to display for this patient.   No past surgical history on file.  Current Outpatient Rx  Name  Route  Sig  Dispense  Refill  . Calcium Carb-Cholecalciferol (CALCIUM 600 + D PO)   Oral   Take 1 tablet by mouth daily.          . carvedilol (COREG) 25 MG tablet   Oral   Take 25 mg by mouth 2 (two) times daily.         . Chromium Picolinate (CHROMIUM PICOLATE) 1000 MCG TABS   Oral   Take 1,000 mcg by mouth daily.         . ciprofloxacin (CIPRO) 500 MG tablet   Oral   Take 1 tablet (500 mg total) by mouth 2 (two) times daily.   20 tablet   0   . hydrochlorothiazide (HYDRODIURIL) 12.5 MG tablet   Oral   Take 12.5 mg by mouth daily.         Marland Kitchen losartan (COZAAR) 100 MG tablet   Oral   Take 100 mg by mouth daily.         . metroNIDAZOLE (FLAGYL) 500 MG tablet   Oral   Take 1 tablet (500 mg total) by mouth 3 (three) times daily.   30 tablet   0   . Multiple Vitamin (MULTIVITAMIN) tablet   Oral   Take 1 tablet by mouth daily.         Marland Kitchen oxyCODONE-acetaminophen (ROXICET) 5-325 MG per  tablet   Oral   Take 1 tablet by mouth every 6 (six) hours as needed.   20 tablet   0   . PRESCRIPTION MEDICATION   Oral   Take 1 capsule by mouth daily. Biestrogen (80/20) Progesterone 0.4mg /50mg .           Allergies Augmentin; Amlodipine; and Sulfa antibiotics  No family history on file.  Social History Social History  Substance Use Topics  . Smoking status: Former Research scientist (life sciences)  . Smokeless tobacco: Not on file  . Alcohol Use: Yes    Review of Systems  Constitutional: Negative for fever. Cardiovascular: Negative for chest pain. Respiratory: Negative for shortness of breath. Gastrointestinal: Positive for abdominal pain, vomiting and diarrhea. Genitourinary: Negative for dysuria. Musculoskeletal: Negative for back pain. Skin: Negative for rash. Neurological: Negative for headaches, focal weakness or numbness.  10-point ROS otherwise negative.  ____________________________________________   PHYSICAL EXAM:  VITAL SIGNS: ED Triage Vitals  Enc Vitals Group     BP 11/01/14 1741 102/81 mmHg     Pulse Rate 11/01/14 1741 96     Resp 11/01/14 1741 20  Temp 11/01/14 1741 98 F (36.7 C)     Temp Source 11/01/14 1741 Oral     SpO2 11/01/14 1741 99 %     Weight 11/01/14 1741 230 lb (104.327 kg)     Height 11/01/14 1741 5\' 10"  (1.778 m)     Head Cir --      Peak Flow --      Pain Score 11/01/14 1743 8   Constitutional: Alert and oriented. Well appearing and in no distress. Eyes: Conjunctivae are normal. PERRL. Normal extraocular movements. ENT   Head: Normocephalic and atraumatic.   Nose: No congestion/rhinnorhea.   Mouth/Throat: Mucous membranes are moist.   Neck: No stridor. Hematological/Lymphatic/Immunilogical: No cervical lymphadenopathy. Cardiovascular: Normal rate, regular rhythm.  No murmurs, rubs, or gallops. Respiratory: Normal respiratory effort without tachypnea nor retractions. Breath sounds are clear and equal bilaterally. No  wheezes/rales/rhonchi. Gastrointestinal: Soft, tender to palpation diffusely. Worse tenderness in the left lower quadrant. No distention. Genitourinary: Deferred Musculoskeletal: Normal range of motion in all extremities. No joint effusions.  No lower extremity tenderness nor edema. Neurologic:  Normal speech and language. No gross focal neurologic deficits are appreciated. Speech is normal.  Skin:  Skin is warm, dry and intact. No rash noted. Psychiatric: Mood and affect are normal. Speech and behavior are normal. Patient exhibits appropriate insight and judgment.  ____________________________________________    LABS (pertinent positives/negatives)  Labs Reviewed  COMPREHENSIVE METABOLIC PANEL - Abnormal; Notable for the following:    Sodium 133 (*)    Chloride 97 (*)    AST 51 (*)    All other components within normal limits  URINALYSIS COMPLETEWITH MICROSCOPIC ([facility] ONLY) - Abnormal; Notable for the following:    Color, Urine YELLOW (*)    APPearance CLEAR (*)    Leukocytes, UA 1+ (*)    Bacteria, UA RARE (*)    Squamous Epithelial / LPF 0-5 (*)    All other components within normal limits  CBC WITH DIFFERENTIAL/PLATELET     ____________________________________________   EKG  None  ____________________________________________    RADIOLOGY  None  ____________________________________________   PROCEDURES  Procedure(s) performed: None  Critical Care performed: No  ____________________________________________   INITIAL IMPRESSION / ASSESSMENT AND PLAN / ED COURSE  Pertinent labs & imaging results that were available during my care of the patient were reviewed by me and considered in my medical decision making (see chart for details).  Patient presented to the emergency department today with continued abdominal pain. This was despite her being on outpatient and bilateral for roughly 3 days. Blood work without any leukocytosis. Additionally patient afebrile.  I do not feel that emergent reimaging is required at this time. However, given that the patient continues to have pain it appears that outpatient therapy has not been sufficient. Will plan on admitting for IV antibiotics, pain control and IV fluid hydration.  ____________________________________________   FINAL CLINICAL IMPRESSION(S) / ED DIAGNOSES  Final diagnoses:  Diverticulitis of intestine without perforation or abscess without bleeding     Nance Pear, MD 11/01/14 2159

## 2014-11-02 LAB — CBC
HCT: 35.7 % (ref 35.0–47.0)
Hemoglobin: 12.1 g/dL (ref 12.0–16.0)
MCH: 33.9 pg (ref 26.0–34.0)
MCHC: 33.9 g/dL (ref 32.0–36.0)
MCV: 100.1 fL — ABNORMAL HIGH (ref 80.0–100.0)
Platelets: 177 K/uL (ref 150–440)
RBC: 3.57 MIL/uL — ABNORMAL LOW (ref 3.80–5.20)
RDW: 12.9 % (ref 11.5–14.5)
WBC: 4.5 K/uL (ref 3.6–11.0)

## 2014-11-02 LAB — BASIC METABOLIC PANEL
Anion gap: 6 (ref 5–15)
BUN: 10 mg/dL (ref 6–20)
CO2: 25 mmol/L (ref 22–32)
CREATININE: 0.73 mg/dL (ref 0.44–1.00)
Calcium: 8.2 mg/dL — ABNORMAL LOW (ref 8.9–10.3)
Chloride: 109 mmol/L (ref 101–111)
Glucose, Bld: 111 mg/dL — ABNORMAL HIGH (ref 65–99)
POTASSIUM: 3.1 mmol/L — AB (ref 3.5–5.1)
SODIUM: 140 mmol/L (ref 135–145)

## 2014-11-02 LAB — MAGNESIUM: Magnesium: 1.9 mg/dL (ref 1.7–2.4)

## 2014-11-02 MED ORDER — SODIUM CHLORIDE 0.9 % IV SOLN
INTRAVENOUS | Status: DC
  Administered 2014-11-02 – 2014-11-03 (×2): via INTRAVENOUS

## 2014-11-02 NOTE — Progress Notes (Signed)
[place] [facility] [place] NAME: Tina Hurst    MR#:  [id number]  DATE OF BIRTH:  07-Aug-1960  SUBJECTIVE:  CHIEF COMPLAINT:   Chief Complaint  Patient presents with  . Abdominal Pain   Abdominal pain is better, no nausea,  vomiting or diarrhea. REVIEW OF SYSTEMS:  CONSTITUTIONAL: No fever, fatigue or weakness.  EYES: No blurred or double vision.  EARS, NOSE, AND THROAT: No tinnitus or ear pain.  RESPIRATORY: No cough, shortness of breath, wheezing or hemoptysis.  CARDIOVASCULAR: No chest pain, orthopnea, edema.  GASTROINTESTINAL: No nausea, vomiting, diarrhea has mild left lower quadrant abdominal pain.  GENITOURINARY: No dysuria, hematuria.  ENDOCRINE: No polyuria, nocturia,  HEMATOLOGY: No anemia, easy bruising or bleeding SKIN: No rash or lesion. MUSCULOSKELETAL: No joint pain or arthritis.   NEUROLOGIC: No tingling, numbness, weakness.  PSYCHIATRY: No anxiety or depression.   DRUG ALLERGIES:   Allergies  Allergen Reactions  . Augmentin [Amoxicillin-Pot Clavulanate] Nausea And Vomiting and Other (See Comments)    Pt states that she is able to take 250mg  tablet.    . Amlodipine Swelling and Palpitations  . Sulfa Antibiotics Rash    VITALS:  Blood pressure 135/79, pulse 68, temperature 98.4 F (36.9 C), temperature source Oral, resp. rate 18, height 5\' 10"  (1.778 m), weight 105.507 kg (232 lb 9.6 oz), SpO2 100 %.  PHYSICAL EXAMINATION:  GENERAL:  54 y.o.-year-old patient lying in the bed with no acute distress.  EYES: Pupils equal, round, reactive to light and accommodation. No scleral icterus. Extraocular muscles intact.  HEENT: Head atraumatic, normocephalic. Oropharynx and nasopharynx clear.  NECK:  Supple, no jugular venous distention. No thyroid enlargement, no tenderness.  LUNGS: Normal breath sounds bilaterally, no wheezing, rales,rhonchi or crepitation. No use of accessory muscles of respiration.   CARDIOVASCULAR: S1, S2 normal. No murmurs, rubs, or gallops.  ABDOMEN: Soft, tenderness in left lower quantity area, nondistended. Bowel sounds present. No organomegaly or mass.  EXTREMITIES: No pedal edema, cyanosis, or clubbing.  NEUROLOGIC: Cranial nerves II through XII are intact. Muscle strength 5/5 in all extremities. Sensation intact. Gait not checked.  PSYCHIATRIC: The patient is alert and oriented x 3.  SKIN: No obvious rash, lesion, or ulcer.    LABORATORY PANEL:   CBC  Recent Labs Lab 11/02/14 0621  WBC 4.5  HGB 12.1  HCT 35.7  PLT 177   ------------------------------------------------------------------------------------------------------------------  Chemistries   Recent Labs Lab 11/01/14 1745 11/02/14 0621 11/02/14 1424  NA 133* 140  --   K 3.5 3.1*  --   CL 97* 109  --   CO2 26 25  --   GLUCOSE 99 111*  --   BUN 12 10  --   CREATININE 0.81 0.73  --   CALCIUM 9.3 8.2*  --   MG  --   --  1.9  AST 51*  --   --   ALT 42  --   --   ALKPHOS 106  --   --   BILITOT 0.5  --   --    ------------------------------------------------------------------------------------------------------------------  Cardiac Enzymes No results for input(s): TROPONINI in the last 168 hours. ------------------------------------------------------------------------------------------------------------------  RADIOLOGY:  No results found.  EKG:   Orders placed or performed in visit on 06/20/13  . EKG 12-Lead    ASSESSMENT AND PLAN:   Diverticulitis - continue Zosyn and start full liquid diet.  HTN (hypertension) - hold home medications for this right now, as her blood pressure  is not elevated.  IBS (irritable bowel syndrome) - continue home medications  Hyperglycemia - glucose has been stable so far.   Hypokalemia, given potassium supplement and follow-up BMP. Magnesium level is normal     All the records are reviewed and case discussed with Care Management/Social  Workerr. Management plans discussed with the patient, family and they are in agreement.  CODE STATUS: Full code  TOTAL TIME TAKING CARE OF THIS PATIENT: 36 minutes.   POSSIBLE D/C IN 2 DAYS, DEPENDING ON CLINICAL CONDITION.   Demetrios Loll M.D on 11/02/2014 at 4:31 PM  Between 7am to 6pm - Pager - [phone]  After 6pm go to [email or web address] - password [email or web address] [place] [facility]  Office  [phone]  CC: Primary care physician; Hortencia Pilar, MD

## 2014-11-03 LAB — BASIC METABOLIC PANEL
Anion gap: 7 (ref 5–15)
BUN: 6 mg/dL (ref 6–20)
CHLORIDE: 111 mmol/L (ref 101–111)
CO2: 24 mmol/L (ref 22–32)
Calcium: 8.2 mg/dL — ABNORMAL LOW (ref 8.9–10.3)
Creatinine, Ser: 0.69 mg/dL (ref 0.44–1.00)
GFR calc Af Amer: >60 mL/min (ref >60)
GLUCOSE: 104 mg/dL — AB (ref 65–99)
POTASSIUM: 3.3 mmol/L — AB (ref 3.5–5.1)
Sodium: 142 mmol/L (ref 135–145)

## 2014-11-03 LAB — GLUCOSE, CAPILLARY: Glucose-Capillary: 101 mg/dL — ABNORMAL HIGH (ref 65–99)

## 2014-11-03 MED ORDER — POTASSIUM CHLORIDE CRYS ER 20 MEQ PO TBCR
40.0000 meq | EXTENDED_RELEASE_TABLET | Freq: Once | ORAL | Status: AC
  Administered 2014-11-03: 40 meq via ORAL
  Filled 2014-11-03: qty 2

## 2014-11-03 NOTE — Discharge Summary (Signed)
[place] [facility] [place] NAME: Tina Hurst    MR#:  [id number]  DATE OF BIRTH:  Sep 14, 1960  DATE OF ADMISSION:  11/01/2014 ADMITTING PHYSICIAN: Lance Coon, MD  DATE OF DISCHARGE: 11/03/2014 12:27 PM  PRIMARY CARE PHYSICIAN: Hortencia Pilar, MD    ADMISSION DIAGNOSIS:  Diverticulitis of intestine without perforation or abscess without bleeding [K57.92]   DISCHARGE DIAGNOSIS:  Acute diverticulitis  SECONDARY DIAGNOSIS:   Past Medical History  Diagnosis Date  . Hypertension   . IBS (irritable bowel syndrome)   . Psoriasis   . Diverticulosis   . Mitral valve prolapse   . Hyperglycemia     HOSPITAL COURSE:   Acute diverticulitis. The patient was treated with Cipro and the Flagyl for 3 days as outpatient without improvement. She was admitted for acute diverticulitis and started with the Zosyn. She tolerated diet and has no complaints. She needs to resume her Cipro and Flagyl after discharge.  HTN (hypertension) - her hypertension medication was on hold due to low sacral pressure. Blood pressure is better and elevated. She will resume home hypertension medication.   IBS (irritable bowel syndrome) - continue home medications  Hyperglycemia - glucose has been stable so far.   Hypokalemia, she was given potassium supplement and follow-up BMP as outpatient. Magnesium level is normal  DISCHARGE CONDITIONS:   The patient is clinically stable and discharged to home today.  CONSULTS OBTAINED:     DRUG ALLERGIES:   Allergies  Allergen Reactions  . Augmentin [Amoxicillin-Pot Clavulanate] Nausea And Vomiting and Other (See Comments)    Pt states that she is able to take 250mg  tablet.    . Amlodipine Swelling and Palpitations  . Sulfa Antibiotics Rash    DISCHARGE MEDICATIONS:   Discharge Medication List as of 11/03/2014 10:47 AM    CONTINUE these medications which have NOT CHANGED   Details  alosetron (LOTRONEX) 0.5 MG  tablet Take 0.25 mg by mouth daily., Until Discontinued, Historical Med    Calcium Carb-Cholecalciferol (CALCIUM 600 + D PO) Take 1 tablet by mouth daily. , Until Discontinued, Historical Med    carvedilol (COREG) 25 MG tablet Take 25 mg by mouth 2 (two) times daily., Until Discontinued, Historical Med    Chromium Picolinate (CHROMIUM PICOLATE) 1000 MCG TABS Take 1,000 mcg by mouth daily., Until Discontinued, Historical Med    ciprofloxacin (CIPRO) 500 MG tablet Take 1 tablet (500 mg total) by mouth 2 (two) times daily., Starting 10/29/2014, Until Wed 11/08/14, Print    hydrochlorothiazide (HYDRODIURIL) 12.5 MG tablet Take 12.5 mg by mouth daily., Until Discontinued, Historical Med    losartan (COZAAR) 100 MG tablet Take 100 mg by mouth daily., Until Discontinued, Historical Med    metroNIDAZOLE (FLAGYL) 500 MG tablet Take 1 tablet (500 mg total) by mouth 3 (three) times daily., Starting 10/29/2014, Until Discontinued, Print    Multiple Vitamin (MULTIVITAMIN WITH MINERALS) TABS tablet Take 1 tablet by mouth daily., Until Discontinued, Historical Med    oxyCODONE-acetaminophen (ROXICET) 5-325 MG per tablet Take 1 tablet by mouth every 6 (six) hours as needed., Starting 10/29/2014, Until Discontinued, Print    PRESCRIPTION MEDICATION Take 1 capsule by mouth daily. Pt takes a compounded estrogen capsule.  Biestrogen (80/20) and Progesterone 0.4mg /50mg ., Until Discontinued, Historical Med         DISCHARGE INSTRUCTIONS:    If you experience worsening of your admission symptoms, develop shortness of breath, life threatening emergency, suicidal or homicidal thoughts you must seek  medical attention immediately by calling 911 or calling your MD immediately  if symptoms less severe.  You Must read complete instructions/literature along with all the possible adverse reactions/side effects for all the Medicines you take and that have been prescribed to you. Take any new Medicines after you have  completely understood and accept all the possible adverse reactions/side effects.   Please note  You were cared for by a hospitalist during your hospital stay. If you have any questions about your discharge medications or the care you received while you were in the hospital after you are discharged, you can call the unit and asked to speak with the hospitalist on call if the hospitalist that took care of you is not available. Once you are discharged, your primary care physician will handle any further medical issues. Please note that NO REFILLS for any discharge medications will be authorized once you are discharged, as it is imperative that you return to your primary care physician (or establish a relationship with a primary care physician if you do not have one) for your aftercare needs so that they can reassess your need for medications and monitor your lab values.    Today   SUBJECTIVE    No complaint  VITAL SIGNS:  Blood pressure 148/76, pulse 73, temperature 97.9 F (36.6 C), temperature source Oral, resp. rate 19, height 5\' 10"  (1.778 m), weight 105.507 kg (232 lb 9.6 oz), SpO2 99 %.  I/O:   Intake/Output Summary (Last 24 hours) at 11/03/14 1342 Last data filed at 11/03/14 1009  Gross per 24 hour  Intake   1485 ml  Output    850 ml  Net    635 ml    PHYSICAL EXAMINATION:  GENERAL:  54 y.o.-year-old patient lying in the bed with no acute distress.  EYES: Pupils equal, round, reactive to light and accommodation. No scleral icterus. Extraocular muscles intact.  HEENT: Head atraumatic, normocephalic. Oropharynx and nasopharynx clear.  NECK:  Supple, no jugular venous distention. No thyroid enlargement, no tenderness.  LUNGS: Normal breath sounds bilaterally, no wheezing, rales,rhonchi or crepitation. No use of accessory muscles of respiration.  CARDIOVASCULAR: S1, S2 normal. No murmurs, rubs, or gallops.  ABDOMEN: Soft, mild tenderness on the left lower quandra area,  non-distended. Bowel sounds present. No organomegaly or mass.  EXTREMITIES: No pedal edema, cyanosis, or clubbing.  NEUROLOGIC: Cranial nerves II through XII are intact. Muscle strength 5/5 in all extremities. Sensation intact. Gait not checked.  PSYCHIATRIC: The patient is alert and oriented x 3.  SKIN: No obvious rash, lesion, or ulcer.   DATA REVIEW:   CBC  Recent Labs Lab 11/02/14 0621  WBC 4.5  HGB 12.1  HCT 35.7  PLT 177    Chemistries   Recent Labs Lab 11/01/14 1745  11/02/14 1424 11/03/14 0414  NA 133*  < >  --  142  K 3.5  < >  --  3.3*  CL 97*  < >  --  111  CO2 26  < >  --  24  GLUCOSE 99  < >  --  104*  BUN 12  < >  --  6  CREATININE 0.81  < >  --  0.69  CALCIUM 9.3  < >  --  8.2*  MG  --   --  1.9  --   AST 51*  --   --   --   ALT 42  --   --   --  ALKPHOS 106  --   --   --   BILITOT 0.5  --   --   --   < > = values in this interval not displayed.  Cardiac Enzymes No results for input(s): TROPONINI in the last 168 hours.  Microbiology Results  No results found for this or any previous visit.  RADIOLOGY:  No results found.      Management plans discussed with the patient, family and they are in agreement.  CODE STATUS:     Code Status Orders        Start     Ordered   11/01/14 2242  Full code   Continuous     11/01/14 2241      TOTAL TIME TAKING CARE OF THIS PATIENT: 32 minutes.    Demetrios Loll M.D on 11/03/2014 at 1:42 PM  Between 7am to 6pm - Pager - [phone]  After 6pm go to [email or web address] - password [email or web address] [place] [facility]  Office  [phone]  CC: Primary care physician; Hortencia Pilar, MD

## 2014-11-03 NOTE — Discharge Instructions (Signed)
Diverticulitis Diverticulitis is when small pockets that have formed in your colon (large intestine) become infected or swollen. HOME CARE  Follow your doctor's instructions.  Follow a special diet if told by your doctor.  When you feel better, your doctor may tell you to change your diet. You may be told to eat a lot of fiber. Fruits and vegetables are good sources of fiber. Fiber makes it easier to poop (have bowel movements).  Take supplements or probiotics as told by your doctor.  Only take medicines as told by your doctor.  Keep all follow-up visits with your doctor. GET HELP IF:  Your pain does not get better.  You have a hard time eating food.  You are not pooping like normal. GET HELP RIGHT AWAY IF:  Your pain gets worse.  Your problems do not get better.  Your problems suddenly get worse.  You have a fever.  You keep throwing up (vomiting).  You have bloody or black, tarry poop (stool). MAKE SURE YOU:   Understand these instructions.  Will watch your condition.  Will get help right away if you are not doing well or get worse. Document Released: 08/20/2007 Document Revised: 03/08/2013 Document Reviewed: 01/26/2013 [facility] Patient Information 2015 [place], [place]. This information is not intended to replace advice given to you by your health care provider. Make sure you discuss any questions you have with your health care provider.  Diverticulitis Diverticulitis is inflammation or infection of small pouches in your colon that form when you have a condition called diverticulosis. The pouches in your colon are called diverticula. Your colon, or large intestine, is where water is absorbed and stool is formed. Complications of diverticulitis can include:  Bleeding.  Severe infection.  Severe pain.  Perforation of your colon.  Obstruction of your colon. CAUSES  Diverticulitis is caused by bacteria. Diverticulitis happens when stool becomes trapped in  diverticula. This allows bacteria to grow in the diverticula, which can lead to inflammation and infection. RISK FACTORS People with diverticulosis are at risk for diverticulitis. Eating a diet that does not include enough fiber from fruits and vegetables may make diverticulitis more likely to develop. SYMPTOMS  Symptoms of diverticulitis may include:  Abdominal pain and tenderness. The pain is normally located on the left side of the abdomen, but may occur in other areas.  Fever and chills.  Bloating.  Cramping.  Nausea.  Vomiting.  Constipation.  Diarrhea.  Blood in your stool. DIAGNOSIS  Your health care provider will ask you about your medical history and do a physical exam. You may need to have tests done because many medical conditions can cause the same symptoms as diverticulitis. Tests may include:  Blood tests.  Urine tests.  Imaging tests of the abdomen, including X-rays and CT scans. When your condition is under control, your health care provider may recommend that you have a colonoscopy. A colonoscopy can show how severe your diverticula are and whether something else is causing your symptoms. TREATMENT  Most cases of diverticulitis are mild and can be treated at home. Treatment may include:  Taking over-the-counter pain medicines.  Following a clear liquid diet.  Taking antibiotic medicines by mouth for 7-10 days. More severe cases may be treated at a hospital. Treatment may include:  Not eating or drinking.  Taking prescription pain medicine.  Receiving antibiotic medicines through an IV tube.  Receiving fluids and nutrition through an IV tube.  Surgery. HOME CARE INSTRUCTIONS   Follow your health care provider's  instructions carefully.  Follow a full liquid diet or other diet as directed by your health care provider. After your symptoms improve, your health care provider may tell you to change your diet. He or she may recommend you eat a  high-fiber diet. Fruits and vegetables are good sources of fiber. Fiber makes it easier to pass stool.  Take fiber supplements or probiotics as directed by your health care provider.  Only take medicines as directed by your health care provider.  Keep all your follow-up appointments. SEEK MEDICAL CARE IF:   Your pain does not improve.  You have a hard time eating food.  Your bowel movements do not return to normal. SEEK IMMEDIATE MEDICAL CARE IF:   Your pain becomes worse.  Your symptoms do not get better.  Your symptoms suddenly get worse.  You have a fever.  You have repeated vomiting.  You have bloody or black, tarry stools. MAKE SURE YOU:   Understand these instructions.  Will watch your condition.  Will get help right away if you are not doing well or get worse. Document Released: 12/11/2004 Document Revised: 03/08/2013 Document Reviewed: 01/26/2013 [facility] Patient Information 2015 [place], [place]. This information is not intended to replace advice given to you by your health care provider. Make sure you discuss any questions you have with your health care provider.  Low sodium and low fat diet. Activity as tolerated.

## 2014-11-03 NOTE — Progress Notes (Signed)
ANTIBIOTIC CONSULT NOTE - FOLLOW UP  Pharmacy Consult for Zosyn Indication: IAI  Allergies  Allergen Reactions  . Augmentin [Amoxicillin-Pot Clavulanate] Nausea And Vomiting and Other (See Comments)    Pt states that she is able to take 250mg  tablet.    . Amlodipine Swelling and Palpitations  . Sulfa Antibiotics Rash    Patient Measurements: Height: 5\' 10"  (177.8 cm) Weight: 232 lb 9.6 oz (105.507 kg) IBW/kg (Calculated) : 68.5 Adjusted Body Weight: 82 kg  Vital Signs: Temp: 97.9 F (36.6 C) (08/19 0754) Temp Source: Oral (08/19 0754) BP: 148/76 mmHg (08/19 0754) Pulse Rate: 73 (08/19 0754) Intake/Output from previous day: 08/18 0701 - 08/19 0700 In: 1365 [P.O.:180; I.V.:1104; IV Piggyback:81] Out: 650 [Urine:650] Intake/Output from this shift: Total I/O In: 120 [P.O.:120] Out: 200 [Urine:200]  Labs:  Recent Labs  11/01/14 1745 11/02/14 0621 11/03/14 0414  WBC 6.6 4.5  --   HGB 14.3 12.1  --   PLT 256 177  --   CREATININE 0.81 0.73 0.69   Estimated Creatinine Clearance: 103.3 mL/min (by C-G formula based on Cr of 0.69). No results for input(s): VANCOTROUGH, VANCOPEAK, VANCORANDOM, GENTTROUGH, GENTPEAK, GENTRANDOM, TOBRATROUGH, TOBRAPEAK, TOBRARND, AMIKACINPEAK, AMIKACINTROU, AMIKACIN in the last 72 hours.   Microbiology: No results found for this or any previous visit (from the past 720 hour(s)).  Medical History: Past Medical History  Diagnosis Date  . Hypertension   . IBS (irritable bowel syndrome)   . Psoriasis   . Diverticulosis   . Mitral valve prolapse   . Hyperglycemia     Medications:  Infusions:  . sodium chloride 75 mL/hr at 11/03/14 0208   Assessment: 54 yof cc abdominal pain recently took Flagyl/Cipro x 3 days for diverticulitis. Continues to have pain/no improvement so represents to ED and starting Zosyn.  Goal of Therapy:  Resolution of abdominal pain, normalization of imaging  Plan:  Expected duration 7 days with resolution of  temperature and/or normalization of WBC. Zosyn 3.375 gm IV Q8H EI.   Alondria Mousseau D, Pharm.D.  Clinical Pharmacist 11/03/2014,10:55 AM

## 2014-11-23 ENCOUNTER — Ambulatory Visit (INDEPENDENT_AMBULATORY_CARE_PROVIDER_SITE_OTHER): Payer: BLUE CROSS/BLUE SHIELD | Admitting: Gastroenterology

## 2014-11-23 ENCOUNTER — Other Ambulatory Visit: Payer: Self-pay

## 2014-11-23 ENCOUNTER — Encounter: Payer: Self-pay | Admitting: Gastroenterology

## 2014-11-23 VITALS — BP 137/71 | HR 70 | Temp 98.0°F | Ht 70.5 in | Wt 234.0 lb

## 2014-11-23 DIAGNOSIS — R197 Diarrhea, unspecified: Secondary | ICD-10-CM

## 2014-11-23 MED ORDER — ALOSETRON HCL 0.5 MG PO TABS: 0.2500 mg | ORAL_TABLET | Freq: Every day | ORAL | Status: DC

## 2014-11-23 MED ORDER — ALOSETRON HCL 0.5 MG PO TABS
0.5000 mg | ORAL_TABLET | Freq: Two times a day (BID) | ORAL | Status: DC
Start: 2014-11-23 — End: 2015-11-30

## 2014-11-23 NOTE — Progress Notes (Signed)
Gastroenterology Consultation  Referring Provider:     Hortencia Pilar, MD Primary Care Physician:  Hortencia Pilar, MD Primary Gastroenterologist:  Dr. Allen Norris     Reason for Consultation:     diverticulitis        HPI:   Tina Hurst is a 54 y.o. y/o female referred for consultation & management of colitis by Dr. Hoy Morn, Ishmael Holter, MD.  This patient reports that she had diverticulitis at the beginning of August. The patient was in the hospital for IV antibiotics after by mouth antibiotics did not resolve her issues. The patient states that she never had diverticulitis in the past and this was the first episode. The patient did have a colonoscopy in 2011 with an adenomatous polyp found at that time. That was in [facility]. The patient states she is no longer having any pain and her bowel movements have not gone back to normal since being on all the antibiotics. She also reports that she has not lost any weight nor is she having any nausea vomiting present time.  Past Medical History  Diagnosis Date  . Hypertension   . IBS (irritable bowel syndrome)   . Psoriasis   . Diverticulosis   . Mitral valve prolapse   . Hyperglycemia     Past Surgical History  Procedure Laterality Date  . Laparoscopic tubal ligation      Prior to Admission medications   Medication Sig Start Date End Date Taking? Authorizing Provider  alosetron (LOTRONEX) 0.5 MG tablet Take 1 tablet (0.5 mg total) by mouth 2 (two) times daily. 11/23/14   Lucilla Lame, MD  Calcium Carb-Cholecalciferol (CALCIUM 600 + D PO) Take 1 tablet by mouth daily.     Historical Provider, MD  carvedilol (COREG) 25 MG tablet Take 25 mg by mouth 2 (two) times daily.    Historical Provider, MD  Chromium Picolinate (CHROMIUM PICOLATE) 1000 MCG TABS Take 1,000 mcg by mouth daily.    Historical Provider, MD  hydrochlorothiazide (HYDRODIURIL) 12.5 MG tablet Take 12.5 mg by mouth daily.    Historical Provider, MD  losartan (COZAAR) 100 MG  tablet Take 100 mg by mouth daily.    Historical Provider, MD  metroNIDAZOLE (FLAGYL) 500 MG tablet Take 1 tablet (500 mg total) by mouth 3 (three) times daily. 10/29/14   Earleen Newport, MD  Multiple Vitamin (MULTIVITAMIN WITH MINERALS) TABS tablet Take 1 tablet by mouth daily.    Historical Provider, MD  oxyCODONE-acetaminophen (ROXICET) 5-325 MG per tablet Take 1 tablet by mouth every 6 (six) hours as needed. Patient taking differently: Take 1 tablet by mouth every 6 (six) hours as needed for severe pain.  10/29/14   Earleen Newport, MD  PRESCRIPTION MEDICATION Take 1 capsule by mouth daily. Pt takes a compounded estrogen capsule.  Biestrogen (80/20) and Progesterone 0.4mg /50mg .    Historical Provider, MD    Family History  Problem Relation Age of Onset  . Irritable bowel syndrome Sister   . Hypertension Mother   . Diabetes Mother   . Hypertension Father   . Alcohol abuse Father      Social History  Substance Use Topics  . Smoking status: Former Research scientist (life sciences)  . Smokeless tobacco: Never Used  . Alcohol Use: 0.0 oz/week    0 Standard drinks or equivalent per week     Comment: occassional - 4 drinks a week    Allergies as of 11/23/2014 - Review Complete 11/01/2014  Allergen Reaction Noted  . Augmentin [amoxicillin-pot clavulanate]  Nausea And Vomiting and Other (See Comments) 10/29/2014  . Amlodipine Swelling and Palpitations 10/29/2014  . Sulfa antibiotics Rash 10/29/2014    Review of Systems:    All systems reviewed and negative except where noted in HPI.   Physical Exam:  BP 137/71 mmHg  Pulse 70  Temp(Src) 98 F (36.7 C) (Oral)  Ht 5' 10.5" (1.791 m)  Wt 234 lb (106.142 kg)  BMI 33.09 kg/m2 No LMP recorded. Patient is postmenopausal. Psych:  Alert and cooperative. Normal mood and affect. General:   Alert,  Well-developed, well-nourished, obese,pleasant and cooperative in NAD Head:  Normocephalic and atraumatic. Eyes:  Sclera clear, no icterus.   Conjunctiva  pink. Ears:  Normal auditory acuity. Nose:  No deformity, discharge, or lesions. Mouth:  No deformity or lesions,oropharynx pink & moist. Neck:  Supple; no masses or thyromegaly. Lungs:  Respirations even and unlabored.  Clear throughout to auscultation.   No wheezes, crackles, or rhonchi. No acute distress. Heart:  Regular rate and rhythm; no murmurs, clicks, rubs, or gallops. Abdomen:  Normal bowel sounds.  No bruits.  Soft, non-tender and non-distended without masses, hepatosplenomegaly or hernias noted.  No guarding or rebound tenderness.  Negative Carnett sign.   Rectal:  Deferred.  Msk:  Symmetrical without gross deformities.  Good, equal movement & strength bilaterally. Pulses:  Normal pulses noted. Extremities:  No clubbing or edema.  No cyanosis. Neurologic:  Alert and oriented x3;  grossly normal neurologically. Skin:  Intact without significant lesions or rashes.  No jaundice. Lymph Nodes:  No significant cervical adenopathy. Psych:  Alert and cooperative. Normal mood and affect.  Imaging Studies: Ct Abdomen Pelvis W Contrast  10/29/2014   CLINICAL DATA:  54 year old female with left lower quadrant abdominal pain for the past 24 hours, progressively worsening.  EXAM: CT ABDOMEN AND PELVIS WITH CONTRAST  TECHNIQUE: Multidetector CT imaging of the abdomen and pelvis was performed using the standard protocol following bolus administration of intravenous contrast.  CONTRAST:  161mL OMNIPAQUE IOHEXOL 300 MG/ML  SOLN  COMPARISON:  No priors.  FINDINGS: Lower chest:  Moderate to large hiatal hernia.  Hepatobiliary: Sub cm low-attenuation lesion in the central aspect of segment 4A of the liver is incompletely characterized, but statistically likely a small cyst. 1.1 x 0.7 cm intermediate attenuation (35 HU) lesion in segment 4B of the liver adjacent to the gallbladder fossa (image 24 of series 2) is indeterminate, but favored to represent a proteinaceous cyst. No other overtly suspicious  hepatic lesions are noted. Status post cholecystectomy. No intrahepatic biliary ductal dilatation. Common bile duct is mildly dilated measuring 9 mm in the porta hepatis, likely reflective of post cholecystectomy physiology.  Pancreas: No pancreatic mass. No pancreatic ductal dilatation. No pancreatic or peripancreatic inflammatory changes.  Spleen: Unremarkable.  Adrenals/Urinary Tract: Bilateral adrenal glands are normal in appearance. Multifocal cortical thinning in the kidneys bilaterally, suggestive of areas of chronic post infectious or inflammatory scarring. 1 cm low-attenuation lesion in the medial aspect of the lower pole the right kidney is compatible with a simple cyst. No hydroureteronephrosis or perinephric stranding to indicate urinary tract obstruction at this time. The urinary bladder is largely decompressed, but otherwise unremarkable in appearance.  Stomach/Bowel: The intra abdominal portion of the stomach is normal. No pathologic dilatation of small bowel or colon. Numerous colonic diverticulae are noted, and in the descending colon there is focal mural thickening in extensive surrounding inflammatory changes, compatible with an acute diverticulitis. No discrete diverticular abscess is identified at this time.  No signs of frank perforation.  Vascular/Lymphatic: Atherosclerosis throughout the abdominal and pelvic vasculature, without evidence of aneurysm or dissection. No lymphadenopathy noted in the abdomen or pelvis.  Reproductive: Uterus and ovaries are are atrophic.  Other: No significant volume of ascites.  No pneumoperitoneum.  Musculoskeletal: There are no aggressive appearing lytic or blastic lesions noted in the visualized portions of the skeleton.  IMPRESSION: 1. Findings, as above, compatible with acute diverticulitis of the descending colon. No signs of diverticular abscess or frank perforation at this time. 2. Given the extensive colonic wall thickening in this area, the possibility of  concurrent neoplasm is not excluded. Although not strongly favored, followup nonemergent colonoscopy is recommended in the near future after resolution of the patient's acute diverticulitis to better evaluate this area and exclude the possibility of concurrent neoplasm. 3. Moderate to large hiatal hernia. 4. Atherosclerosis. 5. Additional incidental findings, as above.   Electronically Signed   By: Vinnie Langton M.D.   On: 10/29/2014 09:39    Assessment and Plan:   Tina Hurst is a 54 y.o. y/o female who had a recent attack of diverticulitis. The patient reports this to be her first attack of diverticulitis. The patient has a adenomatous polyp on a colonoscopy 5 years ago. The patient will need a repeat colonoscopy due to her recent diverticulitis and her history of colon polyps. The patient will wait at least another 5 weeks prior to adding the colonoscopy because of risk of perforation after diverticulitis. The patient has been explained the plan and agrees with it. I have discussed risks & benefits which include, but are not limited to, bleeding, infection, perforation & drug reaction.  The patient agrees with this plan & written consent will be obtained.      Note: This dictation was prepared with Dragon dictation along with smaller phrase technology. Any transcriptional errors that result from this process are unintentional.

## 2014-11-24 ENCOUNTER — Other Ambulatory Visit: Payer: Self-pay

## 2014-12-29 ENCOUNTER — Encounter: Payer: Self-pay | Admitting: *Deleted

## 2015-01-04 NOTE — Discharge Instructions (Signed)

## 2015-01-05 ENCOUNTER — Ambulatory Visit: Payer: BLUE CROSS/BLUE SHIELD | Admitting: Student in an Organized Health Care Education/Training Program

## 2015-01-05 ENCOUNTER — Encounter
Admission: RE | Disposition: A | Discharge status: Home / Self Care | Payer: Self-pay | Source: Ambulatory Visit | Attending: Gastroenterology

## 2015-01-05 ENCOUNTER — Ambulatory Visit
Admission: RE | Admit: 2015-01-05 | Discharge: 2015-01-05 | Disposition: A | Discharge status: Home / Self Care | Payer: BLUE CROSS/BLUE SHIELD | Source: Ambulatory Visit | Attending: Gastroenterology | Admitting: Gastroenterology

## 2015-01-05 ENCOUNTER — Other Ambulatory Visit: Payer: Self-pay | Admitting: Gastroenterology

## 2015-01-05 DIAGNOSIS — I341 Nonrheumatic mitral (valve) prolapse: Secondary | ICD-10-CM | POA: Diagnosis not present

## 2015-01-05 DIAGNOSIS — I10 Essential (primary) hypertension: Secondary | ICD-10-CM | POA: Insufficient documentation

## 2015-01-05 DIAGNOSIS — K5730 Diverticulosis of large intestine without perforation or abscess without bleeding: Secondary | ICD-10-CM | POA: Diagnosis not present

## 2015-01-05 DIAGNOSIS — D122 Benign neoplasm of ascending colon: Secondary | ICD-10-CM | POA: Insufficient documentation

## 2015-01-05 DIAGNOSIS — Z87891 Personal history of nicotine dependence: Secondary | ICD-10-CM | POA: Insufficient documentation

## 2015-01-05 DIAGNOSIS — K5732 Diverticulitis of large intestine without perforation or abscess without bleeding: Secondary | ICD-10-CM | POA: Diagnosis not present

## 2015-01-05 DIAGNOSIS — L409 Psoriasis, unspecified: Secondary | ICD-10-CM | POA: Diagnosis not present

## 2015-01-05 DIAGNOSIS — K635 Polyp of colon: Secondary | ICD-10-CM | POA: Insufficient documentation

## 2015-01-05 DIAGNOSIS — K219 Gastro-esophageal reflux disease without esophagitis: Secondary | ICD-10-CM | POA: Diagnosis not present

## 2015-01-05 DIAGNOSIS — D123 Benign neoplasm of transverse colon: Secondary | ICD-10-CM | POA: Diagnosis not present

## 2015-01-05 DIAGNOSIS — R197 Diarrhea, unspecified: Secondary | ICD-10-CM | POA: Insufficient documentation

## 2015-01-05 HISTORY — PX: COLONOSCOPY WITH PROPOFOL: SHX5780

## 2015-01-05 HISTORY — DX: Presence of spectacles and contact lenses: Z97.3

## 2015-01-05 HISTORY — PX: POLYPECTOMY: SHX5525

## 2015-01-05 HISTORY — DX: Gastro-esophageal reflux disease without esophagitis: K21.9

## 2015-01-05 SURGERY — COLONOSCOPY WITH PROPOFOL
Anesthesia: Monitor Anesthesia Care | Wound class: Contaminated

## 2015-01-05 MED ORDER — LACTATED RINGERS IV SOLN
INTRAVENOUS | Status: DC
  Administered 2015-01-05 (×3): via INTRAVENOUS

## 2015-01-05 MED ORDER — PROPOFOL 10 MG/ML IV BOLUS
INTRAVENOUS | Status: DC | PRN
  Administered 2015-01-05 (×2): 50 mg via INTRAVENOUS
  Administered 2015-01-05 (×2): 30 mg via INTRAVENOUS
  Administered 2015-01-05: 100 mg via INTRAVENOUS
  Administered 2015-01-05: 30 mg via INTRAVENOUS

## 2015-01-05 MED ORDER — OXYCODONE HCL 5 MG PO TABS: 5.0000 mg | ORAL_TABLET | Freq: Once | ORAL | Status: DC | PRN

## 2015-01-05 MED ORDER — DEXAMETHASONE SODIUM PHOSPHATE 4 MG/ML IJ SOLN: 8.0000 mg | Freq: Once | INTRAMUSCULAR | Status: DC | PRN

## 2015-01-05 MED ORDER — ACETAMINOPHEN 160 MG/5ML PO SOLN: 325.0000 mg | ORAL | Status: DC | PRN

## 2015-01-05 MED ORDER — OXYCODONE HCL 5 MG/5ML PO SOLN: 5.0000 mg | Freq: Once | ORAL | Status: DC | PRN

## 2015-01-05 MED ORDER — ACETAMINOPHEN 325 MG PO TABS: 325.0000 mg | ORAL_TABLET | ORAL | Status: DC | PRN

## 2015-01-05 MED ORDER — STERILE WATER FOR IRRIGATION IR SOLN
Status: DC | PRN
  Administered 2015-01-05: 11:00:00

## 2015-01-05 MED ORDER — FENTANYL CITRATE (PF) 100 MCG/2ML IJ SOLN: 25.0000 ug | INTRAMUSCULAR | Status: DC | PRN

## 2015-01-05 MED ORDER — LACTATED RINGERS IV SOLN: 500.0000 mL | INTRAVENOUS | Status: DC

## 2015-01-05 MED ORDER — LIDOCAINE HCL (CARDIAC) 20 MG/ML IV SOLN
INTRAVENOUS | Status: DC | PRN
  Administered 2015-01-05: 50 mg via INTRAVENOUS

## 2015-01-05 SURGICAL SUPPLY — 28 items

## 2015-01-05 NOTE — Anesthesia Procedure Notes (Signed)
Procedure Name: MAC Performed by: Kishaun Erekson Pre-anesthesia Checklist: Patient identified, Emergency Drugs available, Suction available, Timeout performed and Patient being monitored Patient Re-evaluated:Patient Re-evaluated prior to inductionOxygen Delivery Method: Nasal cannula Placement Confirmation: positive ETCO2     

## 2015-01-05 NOTE — Transfer of Care (Signed)
Immediate Anesthesia Transfer of Care Note  Patient: Tina Hurst  Procedure(s) Performed: Procedure(s): COLONOSCOPY WITH PROPOFOL (N/A) POLYPECTOMY  Patient Location: PACU  Anesthesia Type: MAC  Level of Consciousness: awake, alert  and patient cooperative  Airway and Oxygen Therapy: Patient Spontanous Breathing and Patient connected to supplemental oxygen  Post-op Assessment: Post-op Vital signs reviewed, Patient's Cardiovascular Status Stable, Respiratory Function Stable, Patent Airway and No signs of Nausea or vomiting  Post-op Vital Signs: Reviewed and stable  Complications: No apparent anesthesia complications

## 2015-01-05 NOTE — H&P (Signed)
[facility]  [address], [place], [place] [address] Phone: [phone] Fax : [phone]  Primary Care Physician:  Hortencia Pilar, MD Primary Gastroenterologist:  Dr. Allen Norris  Pre-Procedure History & Physical: HPI:  Tina Hurst is a 54 y.o. female is here for an colonoscopy.   Past Medical History  Diagnosis Date  . Hypertension   . IBS (irritable bowel syndrome)   . Psoriasis   . Diverticulosis   . Mitral valve prolapse     followed by PCP  . Hyperglycemia   . GERD (gastroesophageal reflux disease)   . Wears contact lenses     Past Surgical History  Procedure Laterality Date  . Laparoscopic tubal ligation    . Cholecystectomy      Prior to Admission medications   Medication Sig Start Date End Date Taking? Authorizing Provider  alosetron (LOTRONEX) 0.5 MG tablet Take 1 tablet (0.5 mg total) by mouth 2 (two) times daily. 11/23/14  Yes Lucilla Lame, MD  calcium carbonate (TUMS - DOSED IN MG ELEMENTAL CALCIUM) 500 MG chewable tablet Chew 1 tablet by mouth as needed for indigestion or heartburn.   Yes Historical Provider, MD  carvedilol (COREG) 25 MG tablet Take 25 mg by mouth 2 (two) times daily.   Yes Historical Provider, MD  losartan (COZAAR) 100 MG tablet Take 100 mg by mouth daily.   Yes Historical Provider, MD  PRESCRIPTION MEDICATION Take 1 capsule by mouth daily. Pt takes a compounded estrogen capsule.  Biestrogen (80/20) and Progesterone 0.4mg /50mg .   Yes Historical Provider, MD  Calcium Carb-Cholecalciferol (CALCIUM 600 + D PO) Take 1 tablet by mouth daily.     Historical Provider, MD  Chromium Picolinate (CHROMIUM PICOLATE) 1000 MCG TABS Take 1,000 mcg by mouth daily.    Historical Provider, MD  hydrochlorothiazide (HYDRODIURIL) 12.5 MG tablet Take 12.5 mg by mouth daily.    Historical Provider, MD  metroNIDAZOLE (FLAGYL) 500 MG tablet Take 1 tablet (500 mg total) by mouth 3 (three) times daily. 10/29/14   Earleen Newport, MD  Multiple  Vitamin (MULTIVITAMIN WITH MINERALS) TABS tablet Take 1 tablet by mouth daily.    Historical Provider, MD  oxyCODONE-acetaminophen (ROXICET) 5-325 MG per tablet Take 1 tablet by mouth every 6 (six) hours as needed. Patient taking differently: Take 1 tablet by mouth every 6 (six) hours as needed for severe pain.  10/29/14   Earleen Newport, MD    Allergies as of 11/24/2014 - Review Complete 11/01/2014  Allergen Reaction Noted  . Augmentin [amoxicillin-pot clavulanate] Nausea And Vomiting and Other (See Comments) 10/29/2014  . Amlodipine Swelling and Palpitations 10/29/2014  . Sulfa antibiotics Rash 10/29/2014    Family History  Problem Relation Age of Onset  . Irritable bowel syndrome Sister   . Hypertension Mother   . Diabetes Mother   . Hypertension Father   . Alcohol abuse Father     Social History   Social History  . Marital Status: Married    Spouse Name: N/A  . Number of Children: N/A  . Years of Education: N/A   Occupational History  . Not on file.   Social History Main Topics  . Smoking status: Former Research scientist (life sciences)  . Smokeless tobacco: Never Used     Comment: quit about 10 yrs ago  . Alcohol Use: 1.8 oz/week    0 Standard drinks or equivalent, 1 Glasses of wine, 1 Cans of beer, 1 Shots of liquor per week     Comment: occassional - 4 drinks  a week  . Drug Use: No  . Sexual Activity: Not on file   Other Topics Concern  . Not on file   Social History Narrative    Review of Systems: See HPI, otherwise negative ROS  Physical Exam: BP 139/73 mmHg  Pulse 60  Temp(Src) 99.1 F (37.3 C) (Temporal)  Resp 17  Ht 5\' 10"  (1.778 m)  Wt 231 lb (104.781 kg)  BMI 33.15 kg/m2  SpO2 97% General:   Alert,  pleasant and cooperative in NAD Head:  Normocephalic and atraumatic. Neck:  Supple; no masses or thyromegaly. Lungs:  Clear throughout to auscultation.    Heart:  Regular rate and rhythm. Abdomen:  Soft, nontender and nondistended. Normal bowel sounds, without  guarding, and without rebound.   Neurologic:  Alert and  oriented x4;  grossly normal neurologically.  Impression/Plan: Tina Hurst is here for an colonoscopy to be performed for history of colon polyps and recent diverticulitis.  Risks, benefits, limitations, and alternatives regarding  colonoscopy have been reviewed with the patient.  Questions have been answered.  All parties agreeable.   Ollen Bowl, MD  01/05/2015, 10:01 AM

## 2015-01-05 NOTE — Anesthesia Preprocedure Evaluation (Signed)
Anesthesia Evaluation  Patient identified by MRN, date of birth, ID band Patient awake    Reviewed: Allergy & Precautions, H&P , NPO status , Patient's Chart, lab work & pertinent test results, reviewed documented beta blocker date and time   Airway Mallampati: II  TM Distance: >3 FB Neck ROM: full    Dental no notable dental hx.    Pulmonary former smoker,    Pulmonary exam normal breath sounds clear to auscultation       Cardiovascular Exercise Tolerance: Good hypertension, On Medications  Rhythm:regular Rate:Normal     Neuro/Psych negative neurological ROS  negative psych ROS   GI/Hepatic Neg liver ROS, GERD  Medicated,  Endo/Other  negative endocrine ROS  Renal/GU negative Renal ROS  negative genitourinary   Musculoskeletal   Abdominal   Peds  Hematology negative hematology ROS (+)   Anesthesia Other Findings   Reproductive/Obstetrics negative OB ROS                             Anesthesia Physical Anesthesia Plan  ASA: II  Anesthesia Plan: MAC   Post-op Pain Management:    Induction:   Airway Management Planned:   Additional Equipment:   Intra-op Plan:   Post-operative Plan:   Informed Consent: I have reviewed the patients History and Physical, chart, labs and discussed the procedure including the risks, benefits and alternatives for the proposed anesthesia with the patient or authorized representative who has indicated his/her understanding and acceptance.   Dental Advisory Given  Plan Discussed with: CRNA  Anesthesia Plan Comments:         Anesthesia Quick Evaluation

## 2015-01-05 NOTE — Anesthesia Postprocedure Evaluation (Signed)
  Anesthesia Post-op Note  Patient: Tina Hurst  Procedure(s) Performed: Procedure(s): COLONOSCOPY WITH PROPOFOL (N/A) POLYPECTOMY  Anesthesia type:MAC  Patient location: PACU  Post pain: Pain level controlled  Post assessment: Post-op Vital signs reviewed, Patient's Cardiovascular Status Stable, Respiratory Function Stable, Patent Airway and No signs of Nausea or vomiting  Post vital signs: Reviewed and stable  Last Vitals:  Filed Vitals:   01/05/15 0950  BP: 139/73  Pulse: 60  Temp: 37.3 C  Resp: 17    Level of consciousness: awake, alert  and patient cooperative  Complications: No apparent anesthesia complications

## 2015-01-05 NOTE — Op Note (Addendum)
[facility] Patient Name: Tina Hurst Procedure Date: 01/05/2015 10:26 AM MRN: [id number] Account #: [id number] Date of Birth: 11/10/1960 Admit Type: Outpatient Age: 54 Room: [facility] OR ROOM 01 Gender: Female Note Status: Supervisor Override Procedure:         Colonoscopy Indications:       Clinically significant diarrhea of unexplained origin,                     Diverticulitis Providers:         Lucilla Lame, MD Referring MD:      Kerin Perna, MD (Referring MD) Medicines:         Propofol per Anesthesia Complications:     No immediate complications. Procedure:         Pre-Anesthesia Assessment:                    - Prior to the procedure, a History and Physical was                     performed, and patient medications and allergies were                     reviewed. The patient's tolerance of previous anesthesia                     was also reviewed. The risks and benefits of the procedure                     and the sedation options and risks were discussed with the                     patient. All questions were answered, and informed consent                     was obtained. Prior Anticoagulants: The patient has taken                     no previous anticoagulant or antiplatelet agents. ASA                     Grade Assessment: II - A patient with mild systemic                     disease. After reviewing the risks and benefits, the                     patient was deemed in satisfactory condition to undergo                     the procedure.                    After obtaining informed consent, the colonoscope was                     passed under direct vision. Throughout the procedure, the                     patient's blood pressure, pulse, and oxygen saturations                     were monitored continuously. The was introduced through  the anus and advanced to the the terminal ileum. The   colonoscopy was performed without difficulty. The patient                     tolerated the procedure well. The quality of the bowel                     preparation was good. Findings:      The perianal and digital rectal examinations were normal.      The terminal ileum appeared normal. Biopsies were taken with a cold       forceps for histology.      A 5 mm polyp was found in the ascending colon. The polyp was sessile.       The polyp was removed with a cold snare. Resection and retrieval were       complete.      A 3 mm polyp was found in the ascending colon. The polyp was sessile.       The polyp was removed with a cold biopsy forceps. Resection and       retrieval were complete.      Multiple small-mouthed diverticula were found in the sigmoid colon, in       the descending colon and in the transverse colon.      A 3 mm polyp was found in the transverse colon. The polyp was sessile.       The polyp was removed with a cold biopsy forceps. Resection and       retrieval were complete. Impression:        - The examined portion of the ileum was normal. Biopsied.                    - One 5 mm polyp in the ascending colon. Resected and                     retrieved.                    - One 3 mm polyp in the ascending colon. Resected and                     retrieved.                    - Diverticulosis in the sigmoid colon, in the descending                     colon and in the transverse colon.                    - One 3 mm polyp in the transverse colon. Resected and                     retrieved. Recommendation:    - Await pathology results.                    - Repeat colonoscopy in 5 years if polyp adenoma and 10                     years if hyperplastic Procedure Code(s): --- Professional ---                    [phone], Colonoscopy, flexible; with removal of tumor(s),  polyp(s), or other lesion(s) by snare technique                    45380, 59, Colonoscopy,  flexible; with biopsy, single or                     multiple Diagnosis Code(s): --- Professional ---                    R19.7, Diarrhea, unspecified                    K57.32, Diverticulitis of large intestine without                     perforation or abscess without bleeding                    D12.2, Benign neoplasm of ascending colon                    D12.3, Benign neoplasm of transverse colon CPT copyright 2014 [employer] Association. All rights reserved. The codes documented in this report are preliminary and upon coder review may  be revised to meet current compliance requirements. Lucilla Lame, MD 01/05/2015 10:55:28 AM This report has been signed electronically. Number of Addenda: 0 Note Initiated On: 01/05/2015 10:26 AM Scope Withdrawal Time: 0 hours 8 minutes 17 seconds  Total Procedure Duration: 0 hours 11 minutes 35 seconds       [facility]

## 2015-01-08 ENCOUNTER — Encounter: Payer: Self-pay | Admitting: Gastroenterology

## 2015-01-17 ENCOUNTER — Encounter: Payer: Self-pay | Admitting: Gastroenterology

## 2015-04-11 ENCOUNTER — Ambulatory Visit (INDEPENDENT_AMBULATORY_CARE_PROVIDER_SITE_OTHER): Payer: BLUE CROSS/BLUE SHIELD | Admitting: Podiatry

## 2015-04-11 ENCOUNTER — Encounter: Payer: Self-pay | Admitting: Podiatry

## 2015-04-11 VITALS — BP 167/96 | HR 87 | Resp 16 | Ht 70.0 in | Wt 227.0 lb

## 2015-04-11 DIAGNOSIS — L600 Ingrowing nail: Secondary | ICD-10-CM

## 2015-04-11 MED ORDER — NEOMYCIN-POLYMYXIN-HC 1 % OT SOLN: OTIC | Status: DC

## 2015-04-11 NOTE — Progress Notes (Signed)
   Subjective:    Patient ID: Tina Hurst, female    DOB: Nov 01, 1960, 55 y.o.   MRN: [id number]  HPI: She presents today with chief complaint of ingrown toenail fibular border hallux left. States this has been present for the past few months and the pedicurist has tried her best to get it out. States that she is also concerned about the thickening of the distal aspect of the remainder of the nails and are dark colors.    Review of Systems  Hematological: Bruises/bleeds easily.  All other systems reviewed and are negative.      Objective:   Physical Exam: Vital signs stable alert and oriented 3. Pulses are palpable. Neurologic sensorium is intact per Semmes-Weinstein monofilament. Deep tendon reflexes are intact. Muscle strength is 5 over 5 dorsi flex plan flexors and inverters everters alters musculatures intact. Orthopedic evaluation of Mr. gall joints distal to the ankle for range of motion without crepitation. Hammertoe deformities a moderate tibia deformities are noted. Cavus foot deformities noted. Cutaneous evaluation of Mr. supple well-hydrated cutis mild reactive thickening of the nails with hyperpigmentation. I think this is now dystrophy associated with a hammertoe deformities. Also she does have sharp rate now marginal fibular border hallux left with granulation tissue. No purulence and no malodor.        Assessment & Plan:  Assessment ingrown nail fibular border hallux left.  Plan: Chemical matrixectomy was performed today after local anesthesia was administered along the fibular border of the hallux left. She tolerated this procedure well after local anesthesia total's 3 mL 50/50 mixture of Marcaine and lidocaine plain was used for anesthesia. She was given both oral and written home-going instructions for care and soaking of her to I will follow up with her in 1 week. Also prescribed a prescription for Cortisporin Otic to be applied twice daily after soaking.

## 2015-04-11 NOTE — Patient Instructions (Signed)

## 2015-04-18 ENCOUNTER — Ambulatory Visit (INDEPENDENT_AMBULATORY_CARE_PROVIDER_SITE_OTHER): Payer: BLUE CROSS/BLUE SHIELD | Admitting: Podiatry

## 2015-04-18 ENCOUNTER — Encounter: Payer: Self-pay | Admitting: Podiatry

## 2015-04-18 DIAGNOSIS — L600 Ingrowing nail: Secondary | ICD-10-CM

## 2015-04-18 NOTE — Patient Instructions (Signed)

## 2015-04-18 NOTE — Progress Notes (Signed)
She presents today for follow-up of her matrixectomy fibular border hallux left. She continues to soak in Epsom salts and warm water and apply Cortisporin Otic twice daily. She denies fever chills nausea vomiting muscle aches and pains.  Objective: Vital signs are stable she is alert and oriented 3. Pulses are palpable.Marland Kitchen No erythema edema cellulitis drainage or odor margin appears to be healing very well.  Assessment: Well-healing surgical toe hallux left.  Plan: Continue to soak Epsom salts and warm water and apply Cortisporin Otic twice daily until this is completely resolved. She will call with questions or concerns.

## 2015-11-29 ENCOUNTER — Telehealth: Payer: Self-pay | Admitting: Gastroenterology

## 2015-11-29 NOTE — Telephone Encounter (Signed)
Patient has an appt 10/16 with Dr. Allen Norris but needs a 30 refill on Lotronex called in to [facility] in [place].

## 2015-11-30 ENCOUNTER — Other Ambulatory Visit: Payer: Self-pay

## 2015-11-30 DIAGNOSIS — R197 Diarrhea, unspecified: Secondary | ICD-10-CM

## 2015-11-30 MED ORDER — ALOSETRON HCL 0.5 MG PO TABS: 0.5000 mg | ORAL_TABLET | Freq: Two times a day (BID) | ORAL | 0 refills | Status: DC

## 2015-11-30 NOTE — Telephone Encounter (Signed)
Rx for [facility]ronex has been sent to [facility] for a 30 day supply. Will receive further refills at 1 year follow up appt with Dr. Allen Norris.

## 2015-12-03 ENCOUNTER — Other Ambulatory Visit: Payer: Self-pay

## 2015-12-03 DIAGNOSIS — K529 Noninfective gastroenteritis and colitis, unspecified: Secondary | ICD-10-CM

## 2015-12-03 MED ORDER — ALOSETRON HCL 0.5 MG PO TABS: 0.5000 mg | ORAL_TABLET | Freq: Two times a day (BID) | ORAL | 1 refills | Status: DC

## 2015-12-28 ENCOUNTER — Other Ambulatory Visit: Payer: Self-pay

## 2015-12-31 ENCOUNTER — Ambulatory Visit: Payer: BLUE CROSS/BLUE SHIELD | Admitting: Gastroenterology

## 2015-12-31 ENCOUNTER — Ambulatory Visit (INDEPENDENT_AMBULATORY_CARE_PROVIDER_SITE_OTHER): Payer: BLUE CROSS/BLUE SHIELD | Admitting: Gastroenterology

## 2015-12-31 ENCOUNTER — Encounter: Payer: Self-pay | Admitting: Gastroenterology

## 2015-12-31 VITALS — BP 140/77 | HR 73 | Temp 98.1°F | Ht 70.0 in | Wt 244.0 lb

## 2015-12-31 DIAGNOSIS — K580 Irritable bowel syndrome with diarrhea: Secondary | ICD-10-CM

## 2015-12-31 MED ORDER — ALOSETRON HCL 1 MG PO TABS: 1.0000 mg | ORAL_TABLET | Freq: Two times a day (BID) | ORAL | 3 refills | Status: DC

## 2015-12-31 NOTE — Progress Notes (Signed)
Primary Care Physician: Hortencia Pilar, MD  Primary Gastroenterologist:  Dr. Lucilla Lame  Chief Complaint  Patient presents with  . Follow up Diarrhea  . Medication Refill    HPI: Tina Hurst is a 55 y.o. female here For follow-up of her chronic diarrhea. The patient has been on Lotronex and states that she has been doing well. She still has some episodes of diarrhea. The patient has been taking 0.5 mg and is now out of the medication. The patient is here for a refill and the medication is not completely resolving her issues.   Current Outpatient Prescriptions  Medication Sig Dispense Refill  . albuterol (PROAIR HFA) 108 (90 Base) MCG/ACT inhaler Inhale into the lungs.    Marland Kitchen alosetron (LOTRONEX) 0.5 MG tablet Take 1 tablet (0.5 mg total) by mouth 2 (two) times daily. 180 tablet 1  . alosetron (LOTRONEX) 1 MG tablet Take 1 tablet (1 mg total) by mouth 2 (two) times daily. 180 tablet 3  . Calcium Carb-Cholecalciferol (CALCIUM 600 + D PO) Take 1 tablet by mouth daily.     . calcium carbonate (TUMS - DOSED IN MG ELEMENTAL CALCIUM) 500 MG chewable tablet Chew 1 tablet by mouth as needed for indigestion or heartburn.    . carvedilol (COREG) 25 MG tablet Take 25 mg by mouth 2 (two) times daily.    . CHROMIUM PO 1 po daily    . losartan (COZAAR) 100 MG tablet Take 100 mg by mouth daily.    . Multiple Vitamin (MULTIVITAMIN WITH MINERALS) TABS tablet Take 1 tablet by mouth daily.    Marland Kitchen PRESCRIPTION MEDICATION Take 1 capsule by mouth daily. Pt takes a compounded estrogen capsule.  Biestrogen (80/20) and Progesterone 0.4mg /50mg .    . benzonatate (TESSALON) 100 MG capsule TAKE 1 CAPSULE (100 MG TOTAL) BY MOUTH 3 (THREE) TIMES DAILY AS NEEDED FOR COUGH FOR UP TO 7 DAYS.  0  . NEOMYCIN-POLYMYXIN-HYDROCORTISONE (CORTISPORIN) 1 % SOLN otic solution Apply 1-2 drops to toe BID after soaking (Patient not taking: Reported on 12/31/2015) 10 mL 1   No current facility-administered medications for this  visit.     Allergies as of 12/31/2015 - Review Complete 12/31/2015  Allergen Reaction Noted  . Augmentin [amoxicillin-pot clavulanate] Nausea And Vomiting and Other (See Comments) 10/29/2014  . Amlodipine Swelling and Palpitations 10/29/2014  . Ginger Rash 12/29/2014  . Sulfa antibiotics Rash 10/29/2014    ROS:  General: Negative for anorexia, weight loss, fever, chills, fatigue, weakness. ENT: Negative for hoarseness, difficulty swallowing , nasal congestion. CV: Negative for chest pain, angina, palpitations, dyspnea on exertion, peripheral edema.  Respiratory: Negative for dyspnea at rest, dyspnea on exertion, cough, sputum, wheezing.  GI: See history of present illness. GU:  Negative for dysuria, hematuria, urinary incontinence, urinary frequency, nocturnal urination.  Endo: Negative for unusual weight change.    Physical Examination:   BP 140/77   Pulse 73   Temp 98.1 F (36.7 C) (Oral)   Ht 5\' 10"  (1.778 m)   Wt 244 lb (110.7 kg)   BMI 35.01 kg/m   General: Well-nourished, well-developed in no acute distress.  Eyes: No icterus. Conjunctivae pink. Mouth: Oropharyngeal mucosa moist and pink , no lesions erythema or exudate. Lungs: Clear to auscultation bilaterally. Non-labored. Heart: Regular rate and rhythm, no murmurs rubs or gallops.  Abdomen: Bowel sounds are normal, nontender, nondistended, no hepatosplenomegaly or masses, no abdominal bruits or hernia , no rebound or guarding.   Extremities: No lower extremity edema. No clubbing  or deformities. Neuro: Alert and oriented x 3.  Grossly intact. Skin: Warm and dry, no jaundice.   Psych: Alert and cooperative, normal mood and affect.  Labs:    Imaging Studies: No results found.  Assessment and Plan:   Tina Hurst is a 55 y.o. y/o female who has been on Lotronex and is doing better but still has some episodes of diarrhea. The patient will go up on her Lotronex 1 mg. The patient has been explained the plan and  agrees with it.   Note: This dictation was prepared with Dragon dictation along with smaller phrase technology. Any transcriptional errors that result from this process are unintentional.

## 2015-12-31 NOTE — Patient Instructions (Signed)
You have been given a prescription for Lotronex 1mg  twice daily. [facility]

## 2016-06-28 IMAGING — CT CT ABD-PELV W/ CM
1 of 3 series · 13 of 32 positions shown, 18 images · IV contrast (omnipaque)
Comparison: No priors.

CLINICAL DATA: 54-year-old female with left lower quadrant
abdominal pain for the past 24 hours, progressively worsening.

EXAM:
CT ABDOMEN AND PELVIS WITH CONTRAST
TECHNIQUE: Multidetector CT imaging of the abdomen and pelvis was performed
using the standard protocol following bolus administration of
intravenous contrast.
CONTRAST:  100mL OMNIPAQUE IOHEXOL 300 MG/ML  SOLN

[Series 2: routine abd pel with · axial · 0.68mm/px · z∈[-603,-128]mm · 13 of 107 slices shown, 18 images]
[im 6/107  soft-tissue]
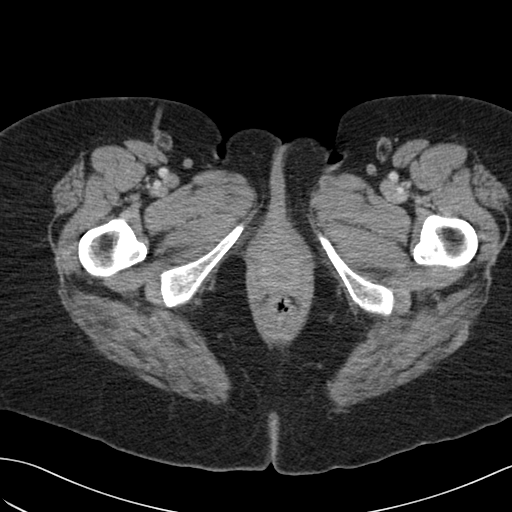
[im 6/107  bone]
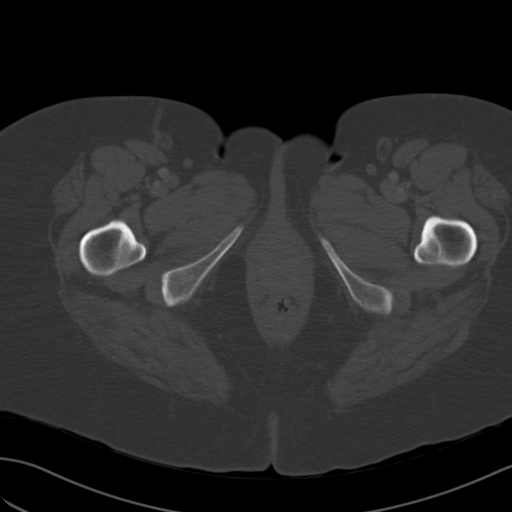
[im 18/107  soft-tissue]
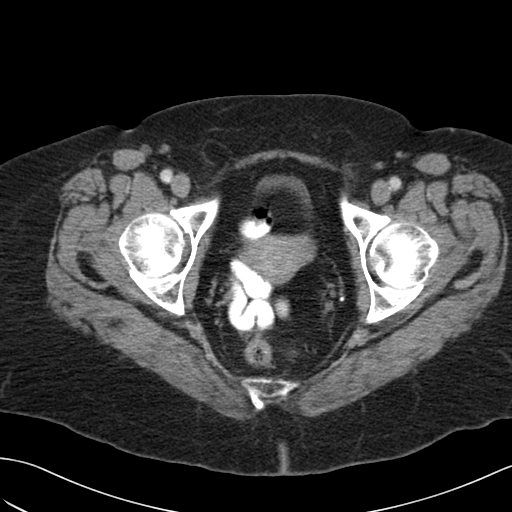
[im 24/107  soft-tissue]
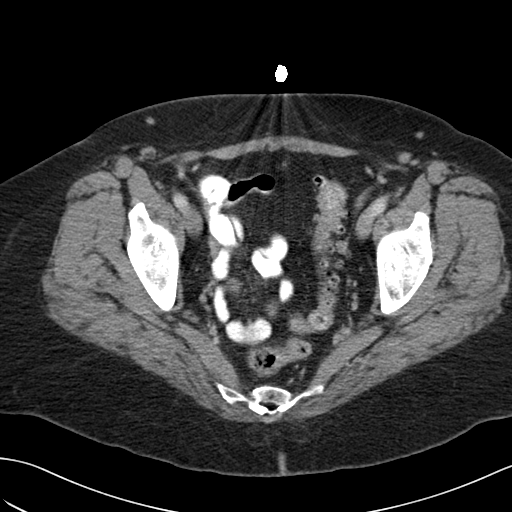
[im 30/107  soft-tissue]
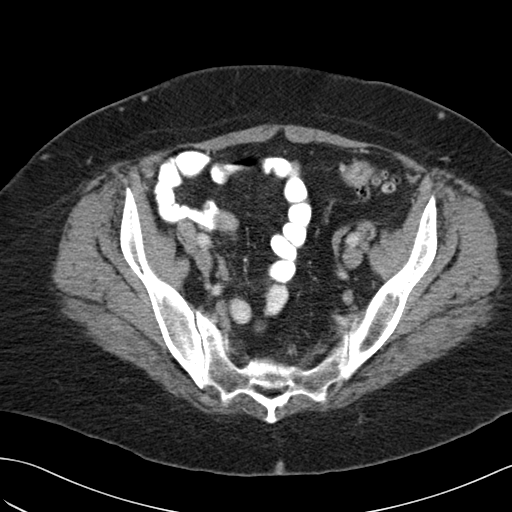
[im 42/107  soft-tissue]
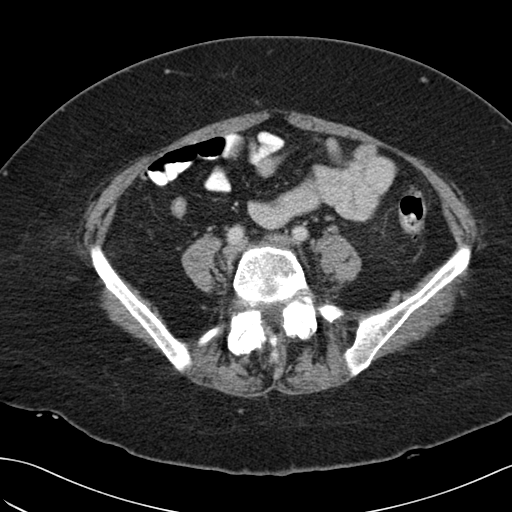
[im 48/107  soft-tissue]
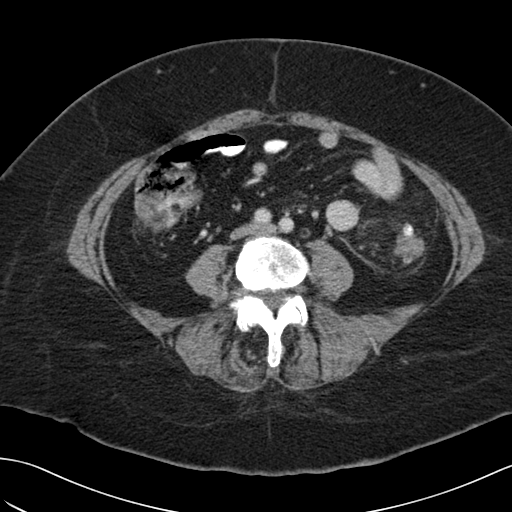
[im 59/107  soft-tissue]
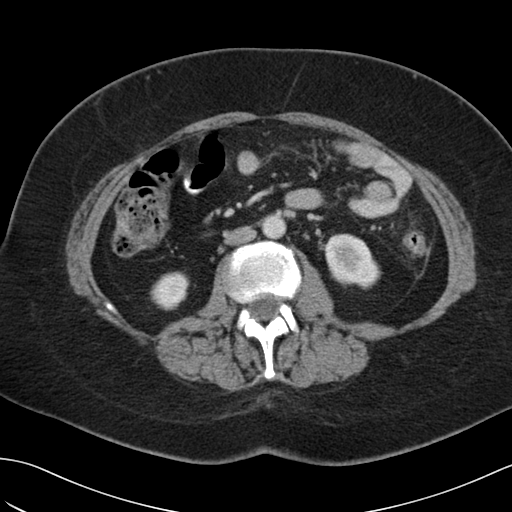
[im 65/107  soft-tissue]
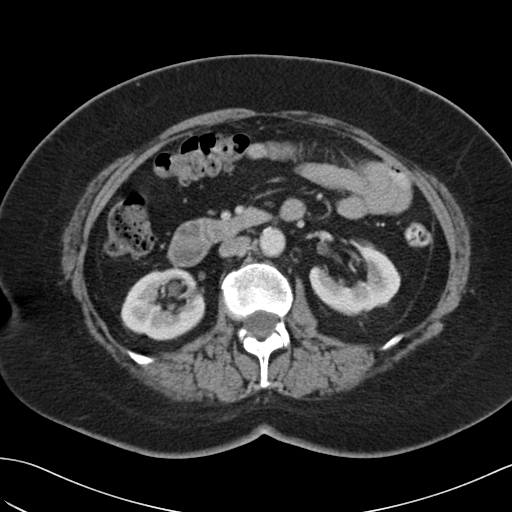
[im 77/107  soft-tissue]
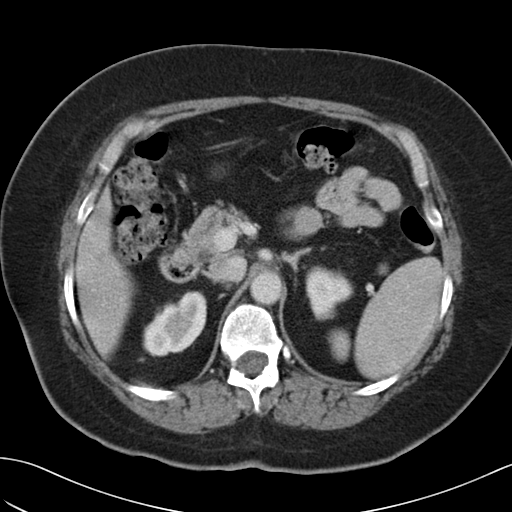
[im 77/107  bone]
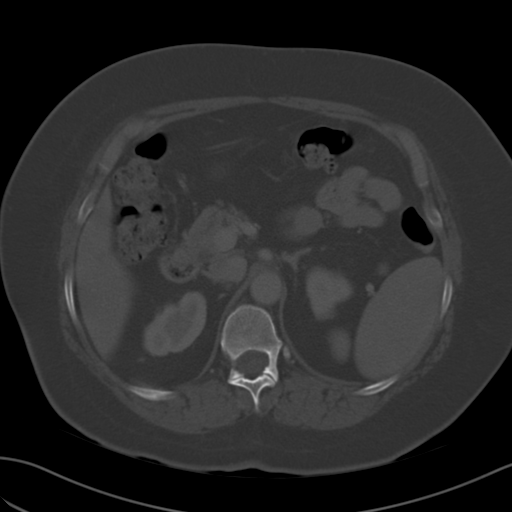
[im 83/107  soft-tissue]
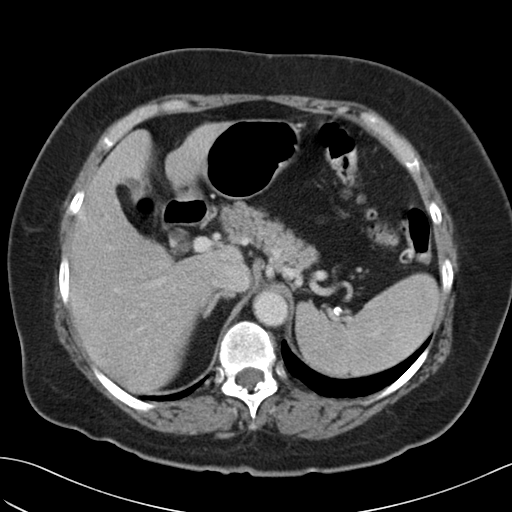
[im 83/107  lung]
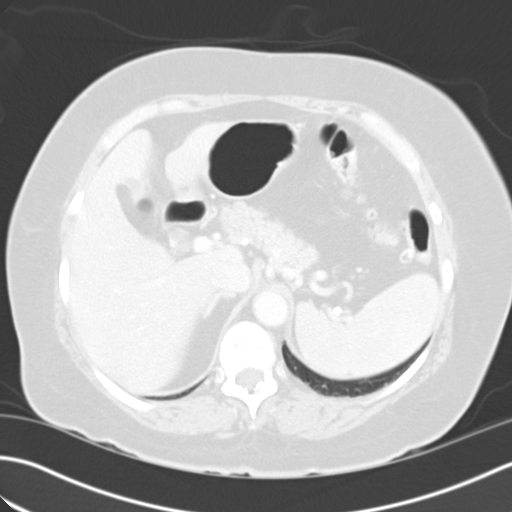
[im 89/107  soft-tissue]
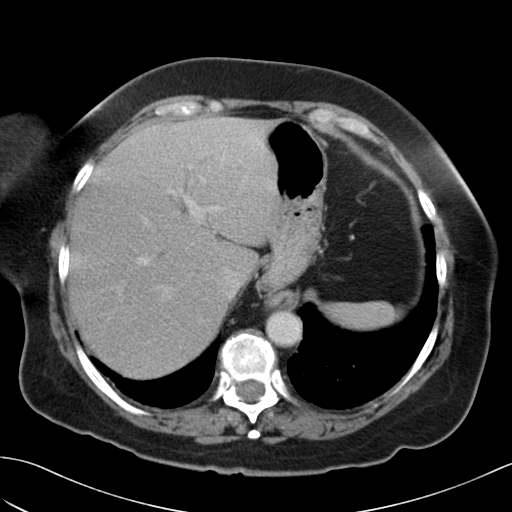
[im 89/107  lung]
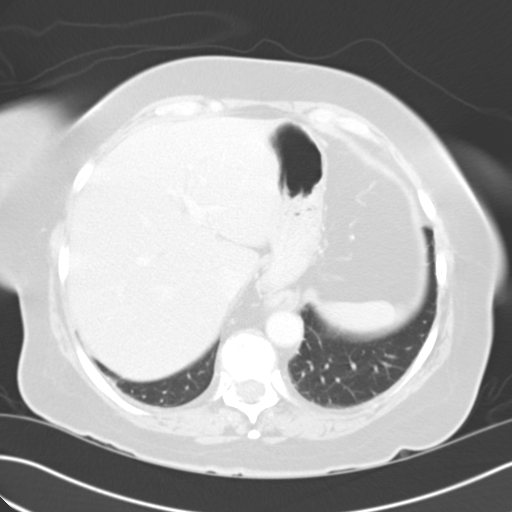
[im 95/107  lung]
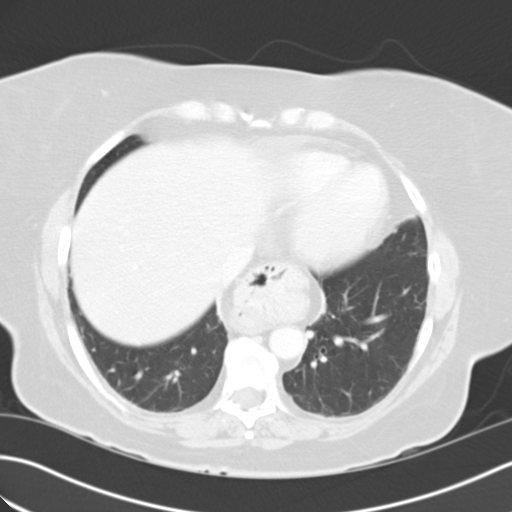
[im 101/107  soft-tissue]
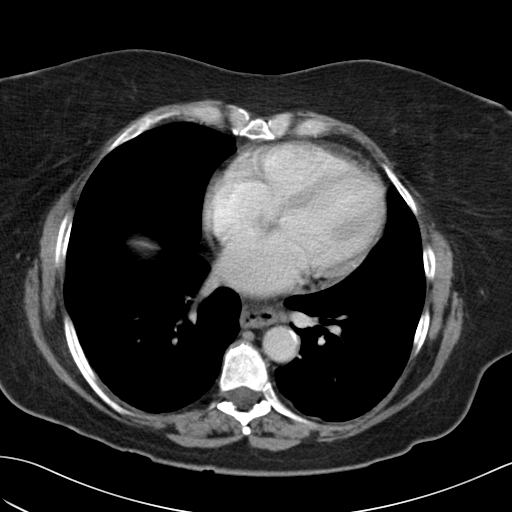
[im 101/107  lung]
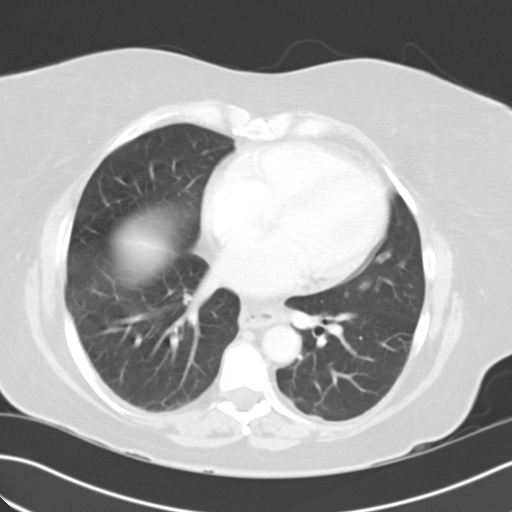

[13 of 32 positions shown; findings below may reference images not displayed]

FINDINGS: Lower chest:  Moderate to large hiatal hernia.

Hepatobiliary: Sub cm low-attenuation lesion in the central aspect
of segment 4A of the liver is incompletely characterized, but
statistically likely a small cyst. 1.1 x 0.7 cm intermediate
attenuation (35 HU) lesion in segment 4B of the liver adjacent to
the gallbladder fossa (image 24 of series 2) is indeterminate, but
favored to represent a proteinaceous cyst. No other overtly
suspicious hepatic lesions are noted. Status post cholecystectomy.
No intrahepatic biliary ductal dilatation. Common bile duct is
mildly dilated measuring 9 mm in the porta hepatis, likely
reflective of post cholecystectomy physiology.

Pancreas: No pancreatic mass. No pancreatic ductal dilatation. No
pancreatic or peripancreatic inflammatory changes.

Spleen: Unremarkable.

Adrenals/Urinary Tract: Bilateral adrenal glands are normal in
appearance. Multifocal cortical thinning in the kidneys bilaterally,
suggestive of areas of chronic post infectious or inflammatory
scarring. 1 cm low-attenuation lesion in the medial aspect of the
lower pole the right kidney is compatible with a simple cyst. No
hydroureteronephrosis or perinephric stranding to indicate urinary
tract obstruction at this time. The urinary bladder is largely
decompressed, but otherwise unremarkable in appearance.

Stomach/Bowel: The intra abdominal portion of the stomach is normal.
No pathologic dilatation of small bowel or colon. Numerous colonic
diverticulae are noted, and in the descending colon there is focal
mural thickening in extensive surrounding inflammatory changes,
compatible with an acute diverticulitis. No discrete diverticular
abscess is identified at this time. No signs of frank perforation.

Vascular/Lymphatic: Atherosclerosis throughout the abdominal and
pelvic vasculature, without evidence of aneurysm or dissection. No
lymphadenopathy noted in the abdomen or pelvis.

Reproductive: Uterus and ovaries are are atrophic.

Other: No significant volume of ascites.  No pneumoperitoneum.

Musculoskeletal: There are no aggressive appearing lytic or blastic
lesions noted in the visualized portions of the skeleton.
IMPRESSION: 1. Findings, as above, compatible with acute diverticulitis of the
descending colon. No signs of diverticular abscess or frank
perforation at this time.
2. Given the extensive colonic wall thickening in this area, the
possibility of concurrent neoplasm is not excluded. Although not
strongly favored, followup nonemergent colonoscopy is recommended in
the near future after resolution of the patient's acute
diverticulitis to better evaluate this area and exclude the
possibility of concurrent neoplasm.
3. Moderate to large hiatal hernia.
4. Atherosclerosis.
5. Additional incidental findings, as above.

## 2016-12-31 ENCOUNTER — Other Ambulatory Visit: Payer: Self-pay

## 2016-12-31 ENCOUNTER — Ambulatory Visit (INDEPENDENT_AMBULATORY_CARE_PROVIDER_SITE_OTHER): Payer: BLUE CROSS/BLUE SHIELD | Admitting: Gastroenterology

## 2016-12-31 ENCOUNTER — Encounter: Payer: Self-pay | Admitting: Gastroenterology

## 2016-12-31 VITALS — BP 147/72 | HR 73 | Temp 98.6°F | Ht 70.0 in | Wt 238.0 lb

## 2016-12-31 DIAGNOSIS — K580 Irritable bowel syndrome with diarrhea: Secondary | ICD-10-CM | POA: Diagnosis not present

## 2016-12-31 NOTE — Progress Notes (Signed)
Primary Care Physician: Hortencia Pilar, MD  Primary Gastroenterologist:  Dr. Lucilla Lame  No chief complaint on file.   HPI: Tina Hurst is a 56 y.o. female here with a history of irritable bowel syndrome diarrhea predominance. The patient had a colonoscopy in October 2016 with a tubular adenoma found at her colonoscopy but random biopsies throughout the entire colon did not show any signs of inflammation or colitis. The terminal ileal biopsies also did not show any abnormalities. The patient reports that she has been doing very well with her symptoms recently. She just needs a prescription refill for her medication. The patient is not due for a colonoscopy until 2021.  Current Outpatient Prescriptions  Medication Sig Dispense Refill  . albuterol (PROAIR HFA) 108 (90 Base) MCG/ACT inhaler Inhale into the lungs.    Marland Kitchen alosetron (LOTRONEX) 0.5 MG tablet Take 1 tablet (0.5 mg total) by mouth 2 (two) times daily. 180 tablet 1  . alosetron (LOTRONEX) 1 MG tablet Take 1 tablet (1 mg total) by mouth 2 (two) times daily. 180 tablet 3  . benzonatate (TESSALON) 100 MG capsule TAKE 1 CAPSULE (100 MG TOTAL) BY MOUTH 3 (THREE) TIMES DAILY AS NEEDED FOR COUGH FOR UP TO 7 DAYS.  0  . Calcium Carb-Cholecalciferol (CALCIUM 600 + D PO) Take 1 tablet by mouth daily.     . calcium carbonate (TUMS - DOSED IN MG ELEMENTAL CALCIUM) 500 MG chewable tablet Chew 1 tablet by mouth as needed for indigestion or heartburn.    . carvedilol (COREG) 25 MG tablet Take 25 mg by mouth 2 (two) times daily.    . Cholecalciferol (VITAMIN D-1000 MAX ST) 1000 units tablet Take by mouth.    . CHROMIUM PO 1 po daily    . Lorcaserin HCl ER (BELVIQ XR) 20 MG TB24 Take by mouth.    . losartan (COZAAR) 100 MG tablet Take 100 mg by mouth daily.    . Multiple Vitamin (MULTIVITAMIN WITH MINERALS) TABS tablet Take 1 tablet by mouth daily.    . NEOMYCIN-POLYMYXIN-HYDROCORTISONE (CORTISPORIN) 1 % SOLN otic solution Apply 1-2 drops to  toe BID after soaking (Patient not taking: Reported on 12/31/2015) 10 mL 1  . phentermine (ADIPEX-P) 37.5 MG tablet Take by mouth.    Marland Kitchen PRESCRIPTION MEDICATION Take 1 capsule by mouth daily. Pt takes a compounded estrogen capsule.  Biestrogen (80/20) and Progesterone 0.4mg /50mg .    Shirley Friar 5 % SOLN USE 1 DROP IN BOTH EYES 2 TIMES A DAY.  3   No current facility-administered medications for this visit.     Allergies as of 12/31/2016 - Review Complete 12/31/2015  Allergen Reaction Noted  . Augmentin [amoxicillin-pot clavulanate] Nausea And Vomiting and Other (See Comments) 10/29/2014  . Amlodipine Swelling and Palpitations 06/23/2012  . Ginger Rash 12/29/2014  . Sulfa antibiotics Rash 10/29/2014    ROS:  General: Negative for anorexia, weight loss, fever, chills, fatigue, weakness. ENT: Negative for hoarseness, difficulty swallowing , nasal congestion. CV: Negative for chest pain, angina, palpitations, dyspnea on exertion, peripheral edema.  Respiratory: Negative for dyspnea at rest, dyspnea on exertion, cough, sputum, wheezing.  GI: See history of present illness. GU:  Negative for dysuria, hematuria, urinary incontinence, urinary frequency, nocturnal urination.  Endo: Negative for unusual weight change.    Physical Examination:   There were no vitals taken for this visit.  General: Well-nourished, well-developed in no acute distress.  Eyes: No icterus. Conjunctivae pink. Mouth: Oropharyngeal mucosa moist and pink , no lesions  erythema or exudate. Lungs: Clear to auscultation bilaterally. Non-labored. Heart: Regular rate and rhythm, no murmurs rubs or gallops.  Abdomen: Bowel sounds are normal, nontender, nondistended, no hepatosplenomegaly or masses, no abdominal bruits or hernia , no rebound or guarding.   Extremities: No lower extremity edema. No clubbing or deformities. Neuro: Alert and oriented x 3.  Grossly intact. Skin: Warm and dry, no jaundice.   Psych: Alert and  cooperative, normal mood and affect.  Labs:    Imaging Studies: No results found.  Assessment and Plan:   Tina Hurst is a 56 y.o. y/o female who comes in with a history of IBS. The patient has been doing well on her present medication and has no complaint the present time. The patient will need a repeat colonoscopy in 2012 due to her adenomatous polyp at her last colonoscopy. The patient only needs a refill of her medications this time. The patient has been explained the plan and agrees with it.    Lucilla Lame, MD. [name]   Note: This dictation was prepared with Dragon dictation along with smaller phrase technology. Any transcriptional errors that result from this process are unintentional.

## 2017-11-26 ENCOUNTER — Other Ambulatory Visit: Payer: Self-pay

## 2017-11-26 ENCOUNTER — Telehealth: Payer: Self-pay | Admitting: Gastroenterology

## 2017-11-26 MED ORDER — ALOSETRON HCL 1 MG PO TABS: 1.0000 mg | ORAL_TABLET | Freq: Two times a day (BID) | ORAL | 3 refills | Status: DC

## 2017-11-26 NOTE — Telephone Encounter (Signed)
Pt caled needing prescription refill/allosetron 1mg . Please call pt. [facility]

## 2017-11-26 NOTE — Telephone Encounter (Signed)
Left vm letting pt know rx for Lotronex will be faxed today to [facility].

## 2018-02-08 ENCOUNTER — Observation Stay
Admission: EM | Admit: 2018-02-08 | Discharge: 2018-02-10 | Disposition: A | Discharge status: Home / Self Care | Payer: 59 | Attending: Specialist | Admitting: Specialist

## 2018-02-08 ENCOUNTER — Encounter: Payer: Self-pay | Admitting: *Deleted

## 2018-02-08 ENCOUNTER — Other Ambulatory Visit: Payer: Self-pay

## 2018-02-08 ENCOUNTER — Emergency Department: Payer: 59

## 2018-02-08 DIAGNOSIS — Z79899 Other long term (current) drug therapy: Secondary | ICD-10-CM | POA: Insufficient documentation

## 2018-02-08 DIAGNOSIS — K449 Diaphragmatic hernia without obstruction or gangrene: Secondary | ICD-10-CM | POA: Diagnosis not present

## 2018-02-08 DIAGNOSIS — D500 Iron deficiency anemia secondary to blood loss (chronic): Principal | ICD-10-CM

## 2018-02-08 DIAGNOSIS — K219 Gastro-esophageal reflux disease without esophagitis: Secondary | ICD-10-CM | POA: Diagnosis not present

## 2018-02-08 DIAGNOSIS — K228 Other specified diseases of esophagus: Secondary | ICD-10-CM | POA: Insufficient documentation

## 2018-02-08 DIAGNOSIS — I10 Essential (primary) hypertension: Secondary | ICD-10-CM | POA: Insufficient documentation

## 2018-02-08 DIAGNOSIS — Z880 Allergy status to penicillin: Secondary | ICD-10-CM | POA: Insufficient documentation

## 2018-02-08 DIAGNOSIS — Z888 Allergy status to other drugs, medicaments and biological substances status: Secondary | ICD-10-CM | POA: Insufficient documentation

## 2018-02-08 DIAGNOSIS — K589 Irritable bowel syndrome without diarrhea: Secondary | ICD-10-CM | POA: Insufficient documentation

## 2018-02-08 DIAGNOSIS — K5730 Diverticulosis of large intestine without perforation or abscess without bleeding: Secondary | ICD-10-CM | POA: Insufficient documentation

## 2018-02-08 DIAGNOSIS — Z881 Allergy status to other antibiotic agents status: Secondary | ICD-10-CM | POA: Diagnosis not present

## 2018-02-08 DIAGNOSIS — Z882 Allergy status to sulfonamides status: Secondary | ICD-10-CM | POA: Insufficient documentation

## 2018-02-08 DIAGNOSIS — D649 Anemia, unspecified: Secondary | ICD-10-CM

## 2018-02-08 DIAGNOSIS — Z87891 Personal history of nicotine dependence: Secondary | ICD-10-CM | POA: Diagnosis not present

## 2018-02-08 DIAGNOSIS — K2289 Other specified disease of esophagus: Secondary | ICD-10-CM

## 2018-02-08 DIAGNOSIS — K5731 Diverticulosis of large intestine without perforation or abscess with bleeding: Secondary | ICD-10-CM

## 2018-02-08 DIAGNOSIS — R739 Hyperglycemia, unspecified: Secondary | ICD-10-CM | POA: Insufficient documentation

## 2018-02-08 DIAGNOSIS — K635 Polyp of colon: Secondary | ICD-10-CM

## 2018-02-08 DIAGNOSIS — D120 Benign neoplasm of cecum: Secondary | ICD-10-CM | POA: Insufficient documentation

## 2018-02-08 DIAGNOSIS — M810 Age-related osteoporosis without current pathological fracture: Secondary | ICD-10-CM | POA: Diagnosis not present

## 2018-02-08 LAB — CBC
HCT: 24 % — ABNORMAL LOW (ref 36.0–46.0)
HEMOGLOBIN: 6.5 g/dL — AB (ref 12.0–15.0)
MCH: 19.9 pg — ABNORMAL LOW (ref 26.0–34.0)
MCHC: 27.1 g/dL — AB (ref 30.0–36.0)
MCV: 73.6 fL — ABNORMAL LOW (ref 80.0–100.0)
NRBC: 0 % (ref 0.0–0.2)
Platelets: 286 10*3/uL (ref 150–400)
RBC: 3.26 MIL/uL — AB (ref 3.87–5.11)
RDW: 18.5 % — ABNORMAL HIGH (ref 11.5–15.5)
WBC: 5.1 10*3/uL (ref 4.0–10.5)

## 2018-02-08 LAB — BASIC METABOLIC PANEL
ANION GAP: 7 (ref 5–15)
BUN: 13 mg/dL (ref 6–20)
CO2: 29 mmol/L (ref 22–32)
Calcium: 8.7 mg/dL — ABNORMAL LOW (ref 8.9–10.3)
Chloride: 113 mmol/L — ABNORMAL HIGH (ref 98–111)
Creatinine, Ser: 0.68 mg/dL (ref 0.44–1.00)
GFR calc non Af Amer: >60 mL/min (ref >60)
Glucose, Bld: 103 mg/dL — ABNORMAL HIGH (ref 70–99)
POTASSIUM: 4.6 mmol/L (ref 3.5–5.1)
SODIUM: 149 mmol/L — AB (ref 135–145)

## 2018-02-08 LAB — PREPARE RBC (CROSSMATCH)

## 2018-02-08 LAB — TROPONIN I

## 2018-02-08 LAB — ABO/RH: ABO/RH(D): O POS

## 2018-02-08 MED ORDER — ALBUTEROL SULFATE (2.5 MG/3ML) 0.083% IN NEBU: 3.0000 mL | INHALATION_SOLUTION | RESPIRATORY_TRACT | Status: DC | PRN

## 2018-02-08 MED ORDER — LOSARTAN POTASSIUM 50 MG PO TABS
100.0000 mg | ORAL_TABLET | Freq: Every day | ORAL | Status: DC
  Administered 2018-02-09: 10:00:00 100 mg via ORAL
  Filled 2018-02-08: qty 2

## 2018-02-08 MED ORDER — VITAMIN D 25 MCG (1000 UNIT) PO TABS
1000.0000 [IU] | ORAL_TABLET | Freq: Every morning | ORAL | Status: DC
  Administered 2018-02-09: 10:00:00 1000 [IU] via ORAL
  Filled 2018-02-08: qty 1

## 2018-02-08 MED ORDER — ADULT MULTIVITAMIN W/MINERALS CH
1.0000 | ORAL_TABLET | Freq: Every day | ORAL | Status: DC
  Administered 2018-02-09: 1 via ORAL
  Filled 2018-02-08: qty 1

## 2018-02-08 MED ORDER — ALOSETRON HCL 1 MG PO TABS: 1.0000 mg | ORAL_TABLET | Freq: Two times a day (BID) | ORAL | Status: DC

## 2018-02-08 MED ORDER — ONDANSETRON HCL 4 MG PO TABS: 4.0000 mg | ORAL_TABLET | Freq: Four times a day (QID) | ORAL | Status: DC | PRN

## 2018-02-08 MED ORDER — HYDROCODONE-ACETAMINOPHEN 5-325 MG PO TABS: 1.0000 | ORAL_TABLET | ORAL | Status: DC | PRN

## 2018-02-08 MED ORDER — DOCUSATE SODIUM 100 MG PO CAPS
100.0000 mg | ORAL_CAPSULE | Freq: Two times a day (BID) | ORAL | Status: DC
  Filled 2018-02-08: qty 1

## 2018-02-08 MED ORDER — SODIUM CHLORIDE 0.9 % IV SOLN
Freq: Once | INTRAVENOUS | Status: AC
  Administered 2018-02-09: 04:00:00 via INTRAVENOUS

## 2018-02-08 MED ORDER — TRAZODONE HCL 50 MG PO TABS: 25.0000 mg | ORAL_TABLET | Freq: Every evening | ORAL | Status: DC | PRN

## 2018-02-08 MED ORDER — HEPARIN SODIUM (PORCINE) 5000 UNIT/ML IJ SOLN: 5000.0000 [IU] | Freq: Three times a day (TID) | INTRAMUSCULAR | Status: DC

## 2018-02-08 MED ORDER — CARVEDILOL 25 MG PO TABS
25.0000 mg | ORAL_TABLET | Freq: Two times a day (BID) | ORAL | Status: DC
  Administered 2018-02-08 – 2018-02-09 (×3): 25 mg via ORAL
  Filled 2018-02-08 (×3): qty 1

## 2018-02-08 MED ORDER — ONDANSETRON HCL 4 MG/2ML IJ SOLN: 4.0000 mg | Freq: Four times a day (QID) | INTRAMUSCULAR | Status: DC | PRN

## 2018-02-08 MED ORDER — CALCIUM CARBONATE ANTACID 500 MG PO CHEW: 1.0000 | CHEWABLE_TABLET | ORAL | Status: DC | PRN

## 2018-02-08 MED ORDER — ACETAMINOPHEN 325 MG PO TABS: 650.0000 mg | ORAL_TABLET | Freq: Four times a day (QID) | ORAL | Status: DC | PRN

## 2018-02-08 MED ORDER — BISACODYL 5 MG PO TBEC: 5.0000 mg | DELAYED_RELEASE_TABLET | Freq: Every day | ORAL | Status: DC | PRN

## 2018-02-08 MED ORDER — SODIUM CHLORIDE 0.9% IV SOLUTION
Freq: Once | INTRAVENOUS | Status: AC
  Administered 2018-02-09 (×2): via INTRAVENOUS
  Filled 2018-02-08: qty 250

## 2018-02-08 MED ORDER — ACETAMINOPHEN 650 MG RE SUPP: 650.0000 mg | Freq: Four times a day (QID) | RECTAL | Status: DC | PRN

## 2018-02-08 MED ORDER — LIFITEGRAST 5 % OP SOLN: 1.0000 [drp] | Freq: Every morning | OPHTHALMIC | Status: DC

## 2018-02-08 NOTE — ED Triage Notes (Signed)
Pt ambulatory to triage.  Pt reports her doctor called today and told her to go to er for blood transfusion.  Pt reports feeling weak and sob.  Denies bloody stools, emesis.  Pt alert  Speech clear.

## 2018-02-08 NOTE — ED Notes (Signed)
Received call from lab indicating that the sample sent for type and screen was submitted incorrectly and needed to be redrawn.

## 2018-02-08 NOTE — H&P (Signed)
[facility] [place] NAME: Tina Hurst    MR#:  [id number]  DATE OF BIRTH:  1961-03-13  DATE OF ADMISSION:  02/08/2018  PRIMARY CARE PHYSICIAN: Hortencia Pilar, MD   REQUESTING/REFERRING PHYSICIAN:   CHIEF COMPLAINT:   Chief Complaint  Patient presents with  . Weakness    HISTORY OF PRESENT ILLNESS: Tina Hurst  is a 57 y.o. female with a known history of diverticulosis, hypertension, IBS and psoriasis. Patient presented to emergency room for severe generalized weakness and fatigue and abnormal labs, showing severe anemia, per PCP. Patient admits to generalized weakness, fatigue and shortness of breath with exertion going on for the past 2 months or so, gradually worsening.  No chest pain.  She does not take any blood thinners and denies any bleeding.  No vaginal bleeding, her last menstrual cycle was 15 years ago.  No weight loss. The only new medications have been phentermine and Belviq, for the past few months.  Patient states she could only take Belviq for 2 to 3 months, due to medical insurance not covering this medication. Blood test done emergency room are notable for low hemoglobin level at 6.5.  Prior hemoglobin level, per EMR has been around 13, three years ago.  Fecal occult blood test, done in emergency room is negative. Recent mammogram, done this year was unremarkable.  Colonoscopy, done 3 years ago showed benign polyps, which were removed. EKG shows normal sinus rhythm with heart rate at 79 bpm, no acute ischemic changes. No active cardiopulmonary disease per chest x-ray. She is admitted for further evaluation and treatment.  PAST MEDICAL HISTORY:   Past Medical History:  Diagnosis Date  . Diverticulosis   . GERD (gastroesophageal reflux disease)   . Hyperglycemia   . Hypertension   . IBS (irritable bowel syndrome)   . Mitral valve prolapse    followed by PCP  . Psoriasis   . Wears contact lenses      PAST SURGICAL HISTORY:  Past Surgical History:  Procedure Laterality Date  . CHOLECYSTECTOMY    . COLONOSCOPY WITH PROPOFOL N/A 01/05/2015   Procedure: COLONOSCOPY WITH PROPOFOL;  Surgeon: Lucilla Lame, MD;  Location: [facility];  Service: Endoscopy;  Laterality: N/A;  . LAPAROSCOPIC TUBAL LIGATION    . POLYPECTOMY  01/05/2015   Procedure: POLYPECTOMY;  Surgeon: Lucilla Lame, MD;  Location: [facility];  Service: Endoscopy;;    SOCIAL HISTORY:  Social History   Tobacco Use  . Smoking status: Former Research scientist (life sciences)  . Smokeless tobacco: Never Used  . Tobacco comment: quit about 10 yrs ago  Substance Use Topics  . Alcohol use: Yes    Alcohol/week: 3.0 standard drinks    Types: 1 Glasses of wine, 1 Cans of beer, 1 Shots of liquor per week    Comment: occassional - 4 drinks a week    FAMILY HISTORY:  Family History  Problem Relation Age of Onset  . Irritable bowel syndrome Sister   . Hypertension Mother   . Diabetes Mother   . Hypertension Father   . Alcohol abuse Father     DRUG ALLERGIES:  Allergies  Allergen Reactions  . Augmentin [Amoxicillin-Pot Clavulanate] Nausea And Vomiting and Other (See Comments)    Pt states that she is able to take 250mg  tablet.    . Amlodipine Swelling and Palpitations  . Ginger Rash  . Sulfa Antibiotics Rash    REVIEW OF SYSTEMS:   CONSTITUTIONAL: No fever, but positive  for severe fatigue and generalized weakness.  EYES: No changes in vision.  EARS, NOSE, AND THROAT: No tinnitus or ear pain.  RESPIRATORY: Positive for shortness of breath with exertion.  No cough, wheezing or hemoptysis.  CARDIOVASCULAR: No chest pain, orthopnea, edema.  GASTROINTESTINAL: No nausea, vomiting, diarrhea or abdominal pain.  GENITOURINARY: No dysuria, hematuria.  ENDOCRINE: No polyuria, nocturia. HEMATOLOGY: No bleeding. SKIN: Positive for pallor.  No rash or lesion. MUSCULOSKELETAL: No joint pain at this time.   NEUROLOGIC: No focal  weakness.  PSYCHIATRY: No anxiety or depression.   MEDICATIONS AT HOME:  Prior to Admission medications   Medication Sig Start Date End Date Taking? Authorizing Provider  losartan-hydrochlorothiazide (HYZAAR) 100-12.5 MG tablet Take 1 tablet by mouth daily.   Yes [provider]  albuterol (PROAIR HFA) 108 (90 Base) MCG/ACT inhaler Inhale into the lungs. 02/07/15 02/07/16  [provider]  alosetron (LOTRONEX) 1 MG tablet Take 1 tablet (1 mg total) by mouth 2 (two) times daily. 11/26/17   Lucilla Lame, MD  Calcium Carb-Cholecalciferol (CALCIUM 600 + D PO) Take 1 tablet by mouth daily.     [provider]  calcium carbonate (TUMS - DOSED IN MG ELEMENTAL CALCIUM) 500 MG chewable tablet Chew 1 tablet by mouth as needed for indigestion or heartburn.    [provider]  carvedilol (COREG) 25 MG tablet Take 25 mg by mouth 2 (two) times daily.    [provider]  Cholecalciferol (VITAMIN D-1000 MAX ST) 1000 units tablet Take by mouth.    [provider]  CHROMIUM PO 1 po daily 07/07/12   [provider]  Lorcaserin HCl ER (BELVIQ XR) 20 MG TB24 Take by mouth. 11/20/16   [provider]  losartan (COZAAR) 100 MG tablet Take 100 mg by mouth daily.    [provider]  Multiple Vitamin (MULTIVITAMIN WITH MINERALS) TABS tablet Take 1 tablet by mouth daily.    [provider]  NEOMYCIN-POLYMYXIN-HYDROCORTISONE (CORTISPORIN) 1 % SOLN otic solution Apply 1-2 drops to toe BID after soaking Patient not taking: Reported on 12/31/2015 04/11/15   Hyatt, Max T, DPM  phentermine (ADIPEX-P) 37.5 MG tablet Take by mouth.    [provider]  PRESCRIPTION MEDICATION Take 1 capsule by mouth daily. Pt takes a compounded estrogen capsule.  Biestrogen (80/20) and Progesterone 0.4mg /50mg .    [provider]  XIIDRA 5 % SOLN USE 1 DROP IN BOTH EYES 2 TIMES A DAY. 10/02/16   [provider]      PHYSICAL  EXAMINATION:   VITAL SIGNS: Blood pressure (!) 162/84, pulse 79, temperature 98.3 F (36.8 C), temperature source Oral, resp. rate 20, height 5\' 10"  (1.778 m), weight 108.9 kg, SpO2 100 %.  GENERAL:  57 y.o.-year-old patient lying in the bed with no acute distress. lying in the bed with no acute distress.  She looks pale. EYES: Pupils equal, round, reactive to light and accommodation. No scleral icterus. Extraocular muscles intact.  HEENT: Head atraumatic, normocephalic. Oropharynx and nasopharynx clear.  NECK:  Supple, no jugular venous distention. No thyroid enlargement, no tenderness.  LUNGS: Normal breath sounds bilaterally, no wheezing, rales,rhonchi or crepitation. No use of accessory muscles of respiration.  CARDIOVASCULAR: S1, S2 normal. No S3/S4.  ABDOMEN: Soft, nontender, nondistended. Bowel sounds present. No organomegaly or mass.  EXTREMITIES: No pedal edema, cyanosis, or clubbing.  NEUROLOGIC: Cranial nerves II through XII are intact. Muscle strength 5/5 in all extremities. Sensation intact.   PSYCHIATRIC: The patient is alert and oriented x 3.  SKIN: No obvious  rash, lesion, or ulcer.   LABORATORY PANEL:   CBC Recent Labs  Lab 02/08/18 1832  WBC 5.1  HGB 6.5*  HCT 24.0*  PLT 286  MCV 73.6*  MCH 19.9*  MCHC 27.1*  RDW 18.5*   ------------------------------------------------------------------------------------------------------------------  Chemistries  Recent Labs  Lab 02/08/18 1832  NA 149*  K 4.6  CL 113*  CO2 29  GLUCOSE 103*  BUN 13  CREATININE 0.68  CALCIUM 8.7*   ------------------------------------------------------------------------------------------------------------------ estimated creatinine clearance is 101.2 mL/min (by C-G formula based on SCr of 0.68 mg/dL). ------------------------------------------------------------------------------------------------------------------ No results for input(s): TSH, T4TOTAL, T3FREE, THYROIDAB in the last 72 hours.  Invalid input(s):  FREET3   Coagulation profile No results for input(s): INR, PROTIME in the last 168 hours. ------------------------------------------------------------------------------------------------------------------- No results for input(s): DDIMER in the last 72 hours. -------------------------------------------------------------------------------------------------------------------  Cardiac Enzymes Recent Labs  Lab 02/08/18 1832  TROPONINI <0.03   ------------------------------------------------------------------------------------------------------------------ Invalid input(s): POCBNP  ---------------------------------------------------------------------------------------------------------------  Urinalysis    Component Value Date/Time   COLORURINE YELLOW (A) 11/01/2014 1745   APPEARANCEUR CLEAR (A) 11/01/2014 1745   APPEARANCEUR Clear 08/21/2013 1159   LABSPEC 1.013 11/01/2014 1745   LABSPEC 1.014 08/21/2013 1159   PHURINE 6.0 11/01/2014 1745   GLUCOSEU NEGATIVE 11/01/2014 1745   GLUCOSEU Negative 08/21/2013 1159   HGBUR NEGATIVE 11/01/2014 1745   BILIRUBINUR NEGATIVE 11/01/2014 1745   BILIRUBINUR Negative 08/21/2013 1159   KETONESUR NEGATIVE 11/01/2014 1745   PROTEINUR NEGATIVE 11/01/2014 1745   NITRITE NEGATIVE 11/01/2014 1745   LEUKOCYTESUR 1+ (A) 11/01/2014 1745   LEUKOCYTESUR Negative 08/21/2013 1159     RADIOLOGY: Dg Chest 2 View  Result Date: 02/08/2018 CLINICAL DATA:  Shortness of breath and weakness for couple of weeks. EXAM: CHEST - 2 VIEW COMPARISON:  None. FINDINGS: Cardiomediastinal silhouette is normal. Mediastinal contours appear intact. There is no evidence of focal airspace consolidation, pleural effusion or pneumothorax. Osseous structures are without acute abnormality. Soft tissues are grossly normal. IMPRESSION: No active cardiopulmonary disease. Electronically Signed   By: Fidela Salisbury M.D.   On: 02/08/2018 19:31    EKG: Orders placed or performed  during the hospital encounter of 02/08/18  . ED EKG within 10 minutes  . ED EKG within 10 minutes    IMPRESSION AND PLAN:  1.  Severe, symptomatic microcytic anemia, of unclear etiology.  No active bleeding noted. Will check iron panel, B12 and folate levels.  Will transfuse 2 units packed RBCs.  Will consult gastroenterology and hematology-oncology services for further evaluation and treatment.  2.  Hypertension, stable, resume home medications.  All the records are reviewed and case discussed with ED provider. Management plans discussed with the patient, who is in agreement.  CODE STATUS: Code Status History    Date Active Date Inactive Code Status Order ID Comments User Context   11/01/2014 2241 11/03/2014 1528 Full Code [id number]  Lance Coon, MD Inpatient       TOTAL TIME TAKING CARE OF THIS PATIENT: 50 minutes.    Amelia Jo M.D on 02/08/2018 at 9:47 PM  Between 7am to 6pm - Pager - [phone]  After 6pm go to [email or web address] - password [email or web address] [facility] [place] [facility]  [phone]  CC: Primary care physician; Hortencia Pilar, MD

## 2018-02-08 NOTE — ED Notes (Signed)
ED TO INPATIENT HANDOFF REPORT  Name/Age/Gender Tina Hurst 57 y.o. female  Code Status Code Status History    Date Active Date Inactive Code Status Order ID Comments User Context   11/01/2014 2241 11/03/2014 1528 Full Code [id number]  Lance Coon, MD Inpatient      Home/SNF/Other Home  Chief Complaint Blood Transfusion  Level of Care/Admitting Diagnosis ED Disposition    ED Disposition Condition [place]: [place] [259563]  Level of Care: Med-Surg [16]  Diagnosis: Severe anemia [8756433]  Admitting Physician: Amelia Jo [[id number]]  Attending Physician: Amelia Jo [phone]  Estimated length of stay: past midnight tomorrow  Certification:: I certify this patient will need inpatient services for at least 2 midnights  PT Class (Do Not Modify): Inpatient [101]  PT Acc Code (Do Not Modify): Private [1]       Medical History Past Medical History:  Diagnosis Date  . Diverticulosis   . GERD (gastroesophageal reflux disease)   . Hyperglycemia   . Hypertension   . IBS (irritable bowel syndrome)   . Mitral valve prolapse    followed by PCP  . Psoriasis   . Wears contact lenses     Allergies Allergies  Allergen Reactions  . Augmentin [Amoxicillin-Pot Clavulanate] Nausea And Vomiting and Other (See Comments)    Pt states that she is able to take 263m tablet.    . Amlodipine Swelling and Palpitations  . Ginger Rash  . Sulfa Antibiotics Rash    IV Location/Drains/Wounds Patient Lines/Drains/Airways Status   Active Line/Drains/Airways    Name:   Placement date:   Placement time:   Site:   Days:   Peripheral IV 02/08/18 Right Antecubital   02/08/18    2035    Antecubital   less than 1   Incision (Closed) 01/05/15 Rectum Other (Comment)   01/05/15    1043     1130          Labs/Imaging Results for orders placed or performed during the hospital encounter of 02/08/18 (from the past 48 hour(s))  Basic  metabolic panel     Status: Abnormal   Collection Time: 02/08/18  6:32 PM  Result Value Ref Range   Sodium 149 (H) 135 - 145 mmol/L   Potassium 4.6 3.5 - 5.1 mmol/L   Chloride 113 (H) 98 - 111 mmol/L   CO2 29 22 - 32 mmol/L   Glucose, Bld 103 (H) 70 - 99 mg/dL   BUN 13 6 - 20 mg/dL   Creatinine, Ser 0.68 0.44 - 1.00 mg/dL   Calcium 8.7 (L) 8.9 - 10.3 mg/dL   GFR calc non Af Amer >60 >60 mL/min   GFR calc Af Amer >60 >60 mL/min    Comment: (NOTE) The eGFR has been calculated using the CKD EPI equation. This calculation has not been validated in all clinical situations. eGFR's persistently <60 mL/min signify possible Chronic Kidney Disease.    Anion gap 7 5 - 15    Comment: [facility] at [facility] [address], [place] [address] CBC     Status: Abnormal   Collection Time: 02/08/18  6:32 PM  Result Value Ref Range   WBC 5.1 4.0 - 10.5 K/uL   RBC 3.26 (L) 3.87 - 5.11 MIL/uL   Hemoglobin 6.5 (L) 12.0 - 15.0 g/dL    Comment: Reticulocyte Hemoglobin testing may be clinically indicated, consider ordering this additional test [id number]   HCT 24.0 (L) 36.0 -  46.0 %   MCV 73.6 (L) 80.0 - 100.0 fL   MCH 19.9 (L) 26.0 - 34.0 pg   MCHC 27.1 (L) 30.0 - 36.0 g/dL   RDW 18.5 (H) 11.5 - 15.5 %   Platelets 286 150 - 400 K/uL   nRBC 0.0 0.0 - 0.2 %    Comment: Performed at [facility], [address] [place], [place] [address]  Troponin I - ONCE - STAT     Status: None   Collection Time: 02/08/18  6:32 PM  Result Value Ref Range   Troponin I <0.03 <0.03 ng/mL    Comment: Performed at [facility], [address] [place], [place] [address]  Sample to Blood Bank     Status: None   Collection Time: 02/08/18  6:35 PM  Result Value Ref Range   Blood Bank Specimen MISSING BB ARMBAND ON TUBE    Sample Expiration      02/11/2018 Performed at [facility], [address] [place], [place] [address]   ABO/Rh     Status: None    Collection Time: 02/08/18  6:36 PM  Result Value Ref Range   ABO/RH(D)      O POS Performed at [facility], [address] [place], [place] [address]   Prepare RBC     Status: None   Collection Time: 02/08/18  7:48 PM  Result Value Ref Range   Order Confirmation      ORDER PROCESSED BY BLOOD BANK Performed at [facility], [address] [place], [place] [address]   Type and screen [place]     Status: None (Preliminary result)   Collection Time: 02/08/18  8:01 PM  Result Value Ref Range   ABO/RH(D) PENDING    Antibody Screen PENDING    Sample Expiration      02/11/2018 Performed at [facility], [address] [place], [place] [address]   Type and screen     Status: None (Preliminary result)   Collection Time: 02/08/18  9:26 PM  Result Value Ref Range   ABO/RH(D) O POS    Antibody Screen NEG    Sample Expiration      02/11/2018 Performed at [facility], [address], [place], [place] [address]    Unit Number [id number]    Blood Component Type RED CELLS,LR    Unit division 00    Status of Unit ALLOCATED    Transfusion Status OK TO TRANSFUSE    Crossmatch Result Compatible    Unit Number [id number]    Blood Component Type RED CELLS,LR    Unit division 00    Status of Unit ALLOCATED    Transfusion Status OK TO TRANSFUSE    Crossmatch Result Compatible    Dg Chest 2 View  Result Date: 02/08/2018 CLINICAL DATA:  Shortness of breath and weakness for couple of weeks. EXAM: CHEST - 2 VIEW COMPARISON:  None. FINDINGS: Cardiomediastinal silhouette is normal. Mediastinal contours appear intact. There is no evidence of focal airspace consolidation, pleural effusion or pneumothorax. Osseous structures are without acute abnormality. Soft tissues are grossly normal. IMPRESSION: No active cardiopulmonary disease. Electronically Signed   By: Fidela Salisbury M.D.   On: 02/08/2018 19:31    Pending  Labs Unresulted Labs (From admission, onward)    Start     Ordered   02/08/18 2149  Iron and TIBC  ONCE - STAT,   STAT     02/08/18 2148  02/08/18 2149  Ferritin  ONCE - STAT,   STAT     02/08/18 2148   02/08/18 2149  Vitamin B12  ONCE - STAT,   STAT     02/08/18 2148   02/08/18 2149  Folate  ONCE - STAT,   STAT     02/08/18 2148   Signed and Held  HIV antibody (Routine Testing)  Once,   R     Signed and Held   Signed and Held  Basic metabolic panel  Tomorrow morning,   R     Signed and Held   Signed and Held  CBC  Tomorrow morning,   R     Signed and Held          Vitals/Pain Today's Vitals   02/08/18 1829 02/08/18 1830 02/08/18 2227 02/08/18 2251  BP: (!) 162/84  (!) 164/84   Pulse: 79  (!) 105   Resp: 20  17   Temp: 98.3 F (36.8 C)     TempSrc: Oral     SpO2: 100%  100%   Weight:  108.9 kg    Height:  '5\' 10"'  (1.778 m)    PainSc:  0-No pain 0-No pain 0-No pain    Isolation Precautions No active isolations  Medications Medications  0.9 %  sodium chloride infusion (Manually program via Guardrails IV Fluids) (has no administration in time range)    Mobility Walks  Pt transported to Rm 125, 1C. Report called to [facility] RN

## 2018-02-08 NOTE — ED Provider Notes (Signed)
[facility] Emergency Department Provider Note   ____________________________________________    I have reviewed the triage vital signs and the nursing notes.   HISTORY  Chief Complaint Weakness     HPI Tina Hurst is a 57 y.o. female sent in by her PCP for severe anemia.  Patient reports over the last several weeks she has had increasing weakness/shortness of breath especially with exertion.  She denies chest pain.  She reports her stools appear to have been normal although she does have irritable bowel syndrome.  Denies blood thinners.  Denies a history of iron deficiency.  No nausea or vomiting.  No vaginal bleeding.  No falls or injury  Past Medical History:  Diagnosis Date  . Diverticulosis   . GERD (gastroesophageal reflux disease)   . Hyperglycemia   . Hypertension   . IBS (irritable bowel syndrome)   . Mitral valve prolapse    followed by PCP  . Psoriasis   . Wears contact lenses     Patient Active Problem List   Diagnosis Date Noted  . Diarrhea   . Diverticulitis of large intestine without perforation or abscess without bleeding   . Benign neoplasm of ascending colon   . Benign neoplasm of transverse colon   . Diverticulitis 11/01/2014  . HTN (hypertension) 11/01/2014  . IBS (irritable bowel syndrome) 11/01/2014  . Hyperglycemia 11/01/2014  . Enthesopathy of hip 08/04/2013  . 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  . Can't get food down 08/20/2011  . Benign essential hypertension 05/14/2011    Past Surgical History:  Procedure Laterality Date  . CHOLECYSTECTOMY    . COLONOSCOPY WITH PROPOFOL N/A 01/05/2015   Procedure: COLONOSCOPY WITH PROPOFOL;  Surgeon: Lucilla Lame, MD;  Location: [place];  Service: Endoscopy;  Laterality: N/A;  . LAPAROSCOPIC TUBAL LIGATION    . POLYPECTOMY  01/05/2015   Procedure: POLYPECTOMY;  Surgeon: Lucilla Lame, MD;  Location: [facility];  Service: Endoscopy;;    Prior to Admission  medications   Medication Sig Start Date End Date Taking? Authorizing Provider  albuterol (PROAIR HFA) 108 (90 Base) MCG/ACT inhaler Inhale into the lungs. 02/07/15 02/07/16  [provider]  alosetron (LOTRONEX) 1 MG tablet Take 1 tablet (1 mg total) by mouth 2 (two) times daily. 11/26/17   Lucilla Lame, MD  Calcium Carb-Cholecalciferol (CALCIUM 600 + D PO) Take 1 tablet by mouth daily.     [provider]  calcium carbonate (TUMS - DOSED IN MG ELEMENTAL CALCIUM) 500 MG chewable tablet Chew 1 tablet by mouth as needed for indigestion or heartburn.    [provider]  carvedilol (COREG) 25 MG tablet Take 25 mg by mouth 2 (two) times daily.    [provider]  Cholecalciferol (VITAMIN D-1000 MAX ST) 1000 units tablet Take by mouth.    [provider]  CHROMIUM PO 1 po daily 07/07/12   [provider]  Lorcaserin HCl ER (BELVIQ XR) 20 MG TB24 Take by mouth. 11/20/16   [provider]  losartan (COZAAR) 100 MG tablet Take 100 mg by mouth daily.    [provider]  Multiple Vitamin (MULTIVITAMIN WITH MINERALS) TABS tablet Take 1 tablet by mouth daily.    [provider]  NEOMYCIN-POLYMYXIN-HYDROCORTISONE (CORTISPORIN) 1 % SOLN otic solution Apply 1-2 drops to toe BID after soaking Patient not taking: Reported on 12/31/2015 04/11/15   Hyatt, Max T, DPM  phentermine (ADIPEX-P) 37.5 MG tablet Take by mouth.    [provider]  PRESCRIPTION MEDICATION Take 1 capsule by mouth daily. Pt takes a compounded estrogen capsule.  Biestrogen (80/20) and Progesterone 0.4mg /50mg .    [provider]  XIIDRA 5 % SOLN USE 1 DROP IN BOTH EYES 2 TIMES A DAY. 10/02/16   [provider]     Allergies Augmentin [amoxicillin-pot clavulanate]; Amlodipine; Ginger; and Sulfa antibiotics  Family History  Problem Relation Age of Onset  . Irritable bowel syndrome Sister   . Hypertension Mother   . Diabetes Mother   .  Hypertension Father   . Alcohol abuse Father     Social History Social History   Tobacco Use  . Smoking status: Former Research scientist (life sciences)  . Smokeless tobacco: Never Used  . Tobacco comment: quit about 10 yrs ago  Substance Use Topics  . Alcohol use: Yes    Alcohol/week: 3.0 standard drinks    Types: 1 Glasses of wine, 1 Cans of beer, 1 Shots of liquor per week    Comment: occassional - 4 drinks a week  . Drug use: No    Review of Systems  Constitutional: No fever/chills Eyes: No visual changes.  ENT: No sore throat. Cardiovascular: Denies chest pain. Respiratory: Denies shortness of breath. Gastrointestinal: No abdominal pain.   Genitourinary: No vaginal bleeding Musculoskeletal: Negative for back pain. Skin: Negative for rash. Neurological: Negative for headaches    ____________________________________________   PHYSICAL EXAM:  VITAL SIGNS: ED Triage Vitals  Enc Vitals Group     BP 02/08/18 1829 (!) 162/84     Pulse Rate 02/08/18 1829 79     Resp 02/08/18 1829 20     Temp 02/08/18 1829 98.3 F (36.8 C)     Temp Source 02/08/18 1829 Oral     SpO2 02/08/18 1829 100 %     Weight 02/08/18 1830 108.9 kg (240 lb)     Height 02/08/18 1830 1.778 m (5\' 10" )     Head Circumference --      Peak Flow --      Pain Score 02/08/18 1830 0     Pain Loc --      Pain Edu? --      Excl. in [place]? --     Constitutional: Alert and oriented.  Eyes: Conjunctivae are normal.   Nose: No congestion/rhinnorhea. Mouth/Throat: Mucous membranes are moist.    Cardiovascular: Normal rate, regular rhythm.  Good peripheral circulation. Respiratory: Normal respiratory effort.  No retractions.  Gastrointestinal: Soft and nontender. No distention.  Guaiac negative light brown stool, only small sample collected  Musculoskeletal:  Warm and well perfused Neurologic:  Normal speech and language. No gross focal neurologic deficits are appreciated.  Skin:  Skin is warm, dry and intact. No rash  noted. Psychiatric: Mood and affect are normal. Speech and behavior are normal.  ____________________________________________   LABS (all labs ordered are listed, but only abnormal results are displayed)  Labs Reviewed  BASIC METABOLIC PANEL - Abnormal; Notable for the following components:      Result Value   Sodium 149 (*)    Chloride 113 (*)    Glucose, Bld 103 (*)    Calcium 8.7 (*)    All other components within normal limits  CBC - Abnormal; Notable for the following components:   RBC 3.26 (*)    Hemoglobin 6.5 (*)    HCT 24.0 (*)    MCV 73.6 (*)    MCH 19.9 (*)    MCHC 27.1 (*)    RDW 18.5 (*)  All other components within normal limits  TROPONIN I  SAMPLE TO BLOOD BANK  PREPARE RBC (CROSSMATCH)   ____________________________________________  EKG  ED ECG REPORT I, Lavonia Drafts, the attending physician, personally viewed and interpreted this ECG.  Date: 02/08/2018  Rhythm: normal sinus rhythm QRS Axis: normal Intervals: normal ST/T Wave abnormalities: normal Narrative Interpretation: no evidence of acute ischemia  ____________________________________________  RADIOLOGY  Chest x-ray normal ____________________________________________   PROCEDURES  Procedure(s) performed: No  Procedures   Critical Care performed: yes  CRITICAL CARE Performed by: Lavonia Drafts   Total critical care time: 30 minutes  Critical care time was exclusive of separately billable procedures and treating other patients.  Critical care was necessary to treat or prevent imminent or life-threatening deterioration.  Critical care was time spent personally by me on the following activities: development of treatment plan with patient and/or surrogate as well as nursing, discussions with consultants, evaluation of patient's response to treatment, examination of patient, obtaining history from patient or surrogate, ordering and performing treatments and interventions,  ordering and review of laboratory studies, ordering and review of radiographic studies, pulse oximetry and re-evaluation of patient's condition.  ____________________________________________   INITIAL IMPRESSION / ASSESSMENT AND PLAN / ED COURSE  Pertinent labs & imaging results that were available during my care of the patient were reviewed by me and considered in my medical decision making (see chart for details).  Patient presents with exertional shortness of breath, occasional lightheadedness.  Her hemoglobin is 6.5, last known value was in the 12 range.  She is guaiac negative although not a lot of stool in the vault.  No other sources of vaginal bleeding, suspect iron deficiency as a cause.  She will require blood transfusion.  Will admit to the hospital service    ____________________________________________   FINAL CLINICAL IMPRESSION(S) / ED DIAGNOSES  Final diagnoses:  Symptomatic anemia        Note:  This document was prepared using Dragon voice recognition software and may include unintentional dictation errors.    Lavonia Drafts, MD 02/08/18 2037

## 2018-02-09 ENCOUNTER — Encounter
Admission: EM | Disposition: A | Discharge status: Home / Self Care | Payer: Self-pay | Source: Home / Self Care | Attending: Emergency Medicine

## 2018-02-09 ENCOUNTER — Ambulatory Visit: Payer: BLUE CROSS/BLUE SHIELD | Admitting: Gastroenterology

## 2018-02-09 ENCOUNTER — Inpatient Hospital Stay: Payer: 59 | Admitting: Anesthesiology

## 2018-02-09 ENCOUNTER — Other Ambulatory Visit: Payer: Self-pay

## 2018-02-09 DIAGNOSIS — D500 Iron deficiency anemia secondary to blood loss (chronic): Secondary | ICD-10-CM | POA: Diagnosis not present

## 2018-02-09 DIAGNOSIS — D649 Anemia, unspecified: Secondary | ICD-10-CM

## 2018-02-09 DIAGNOSIS — K449 Diaphragmatic hernia without obstruction or gangrene: Secondary | ICD-10-CM

## 2018-02-09 DIAGNOSIS — K228 Other specified diseases of esophagus: Secondary | ICD-10-CM | POA: Diagnosis not present

## 2018-02-09 DIAGNOSIS — K2289 Other specified disease of esophagus: Secondary | ICD-10-CM

## 2018-02-09 HISTORY — PX: ESOPHAGOGASTRODUODENOSCOPY: SHX5428

## 2018-02-09 LAB — PROTIME-INR
INR: 1.1
PROTHROMBIN TIME: 14.1 s (ref 11.4–15.2)

## 2018-02-09 LAB — BASIC METABOLIC PANEL
ANION GAP: 9 (ref 5–15)
BUN: 12 mg/dL (ref 6–20)
CHLORIDE: 106 mmol/L (ref 98–111)
CO2: 25 mmol/L (ref 22–32)
Calcium: 8.4 mg/dL — ABNORMAL LOW (ref 8.9–10.3)
Creatinine, Ser: 0.71 mg/dL (ref 0.44–1.00)
GFR calc Af Amer: >60 mL/min (ref >60)
GFR calc non Af Amer: >60 mL/min (ref >60)
GLUCOSE: 125 mg/dL — AB (ref 70–99)
POTASSIUM: 3.6 mmol/L (ref 3.5–5.1)
Sodium: 140 mmol/L (ref 135–145)

## 2018-02-09 LAB — RETICULOCYTES
Immature Retic Fract: 15.9 % (ref 2.3–15.9)
RBC.: 3.14 MIL/uL — ABNORMAL LOW (ref 3.87–5.11)
Retic Count, Absolute: 37.7 10*3/uL (ref 19.0–186.0)
Retic Ct Pct: 1.2 % (ref 0.4–3.1)

## 2018-02-09 LAB — HEMOGLOBIN AND HEMATOCRIT, BLOOD
HCT: 28.5 % — ABNORMAL LOW (ref 36.0–46.0)
HEMOGLOBIN: 8.4 g/dL — AB (ref 12.0–15.0)

## 2018-02-09 LAB — FERRITIN: FERRITIN: 3 ng/mL — AB (ref 11–307)

## 2018-02-09 LAB — VITAMIN B12: VITAMIN B 12: 469 pg/mL (ref 180–914)

## 2018-02-09 LAB — CBC
HCT: 23.6 % — ABNORMAL LOW (ref 36.0–46.0)
HEMOGLOBIN: 6.7 g/dL — AB (ref 12.0–15.0)
MCH: 21.1 pg — AB (ref 26.0–34.0)
MCHC: 28.4 g/dL — ABNORMAL LOW (ref 30.0–36.0)
MCV: 74.2 fL — AB (ref 80.0–100.0)
Platelets: 229 10*3/uL (ref 150–400)
RBC: 3.18 MIL/uL — AB (ref 3.87–5.11)
RDW: 18.2 % — ABNORMAL HIGH (ref 11.5–15.5)
WBC: 4.8 10*3/uL (ref 4.0–10.5)
nRBC: 0 % (ref 0.0–0.2)

## 2018-02-09 LAB — OCCULT BLOOD X 1 CARD TO LAB, STOOL: Fecal Occult Bld: NEGATIVE

## 2018-02-09 LAB — FOLATE: Folate: 26 ng/mL (ref >5.9)

## 2018-02-09 LAB — GLUCOSE, CAPILLARY: GLUCOSE-CAPILLARY: 111 mg/dL — AB (ref 70–99)

## 2018-02-09 LAB — IRON AND TIBC
Iron: 14 ug/dL — ABNORMAL LOW (ref 28–170)
SATURATION RATIOS: 3 % — AB (ref 10.4–31.8)
TIBC: 484 ug/dL — AB (ref 250–450)
UIBC: 470 ug/dL

## 2018-02-09 LAB — TSH: TSH: 4.35 u[IU]/mL (ref 0.350–4.500)

## 2018-02-09 SURGERY — EGD (ESOPHAGOGASTRODUODENOSCOPY)
Anesthesia: General

## 2018-02-09 MED ORDER — SODIUM CHLORIDE 0.9 % IV SOLN: INTRAVENOUS | Status: DC

## 2018-02-09 MED ORDER — PROPOFOL 500 MG/50ML IV EMUL
INTRAVENOUS | Status: DC | PRN
  Administered 2018-02-09: 150 ug/kg/min via INTRAVENOUS

## 2018-02-09 MED ORDER — SODIUM CHLORIDE 0.9 % IV SOLN
25.0000 mg | Freq: Once | INTRAVENOUS | Status: AC
  Administered 2018-02-09: 11:00:00 25 mg via INTRAVENOUS
  Filled 2018-02-09: qty 0.5

## 2018-02-09 MED ORDER — PANTOPRAZOLE SODIUM 40 MG IV SOLR
40.0000 mg | Freq: Two times a day (BID) | INTRAVENOUS | Status: DC
  Administered 2018-02-09 (×2): 40 mg via INTRAVENOUS
  Filled 2018-02-09 (×2): qty 40

## 2018-02-09 MED ORDER — VITAMIN C 500 MG PO TABS
250.0000 mg | ORAL_TABLET | Freq: Two times a day (BID) | ORAL | Status: DC
  Administered 2018-02-09: 20:00:00 250 mg via ORAL
  Filled 2018-02-09: qty 1

## 2018-02-09 MED ORDER — ACETAMINOPHEN 325 MG PO TABS
650.0000 mg | ORAL_TABLET | Freq: Once | ORAL | Status: AC
  Administered 2018-02-09: 10:00:00 650 mg via ORAL
  Filled 2018-02-09: qty 2

## 2018-02-09 MED ORDER — FERROUS SULFATE 325 (65 FE) MG PO TABS: 325.0000 mg | ORAL_TABLET | Freq: Two times a day (BID) | ORAL | Status: DC

## 2018-02-09 MED ORDER — PROPOFOL 500 MG/50ML IV EMUL
INTRAVENOUS | Status: AC
  Filled 2018-02-09: qty 50

## 2018-02-09 MED ORDER — PROPOFOL 10 MG/ML IV BOLUS
INTRAVENOUS | Status: DC | PRN
  Administered 2018-02-09: 60 mg via INTRAVENOUS
  Administered 2018-02-09: 20 mg via INTRAVENOUS

## 2018-02-09 MED ORDER — SODIUM CHLORIDE 0.9% IV SOLUTION: Freq: Once | INTRAVENOUS | Status: DC

## 2018-02-09 MED ORDER — IRON DEXTRAN 50 MG/ML IJ SOLN: 100.0000 mg | Freq: Once | INTRAMUSCULAR | Status: DC

## 2018-02-09 MED ORDER — BISACODYL 5 MG PO TBEC
10.0000 mg | DELAYED_RELEASE_TABLET | Freq: Once | ORAL | Status: AC
  Administered 2018-02-09: 10 mg via ORAL
  Filled 2018-02-09: qty 2

## 2018-02-09 MED ORDER — SODIUM CHLORIDE 0.9 % IV SOLN
INTRAVENOUS | Status: DC
  Administered 2018-02-09 (×2): via INTRAVENOUS

## 2018-02-09 MED ORDER — FUROSEMIDE 10 MG/ML IJ SOLN
20.0000 mg | Freq: Once | INTRAMUSCULAR | Status: AC
  Administered 2018-02-09: 10:00:00 20 mg via INTRAVENOUS
  Filled 2018-02-09: qty 2

## 2018-02-09 MED ORDER — SODIUM CHLORIDE 0.9 % IV SOLN
100.0000 mg | Freq: Once | INTRAVENOUS | Status: AC
  Administered 2018-02-09: 13:00:00 100 mg via INTRAVENOUS
  Filled 2018-02-09: qty 2

## 2018-02-09 MED ORDER — LIDOCAINE HCL (PF) 2 % IJ SOLN
INTRAMUSCULAR | Status: DC | PRN
  Administered 2018-02-09: 100 mg via INTRADERMAL

## 2018-02-09 MED ORDER — DIPHENHYDRAMINE HCL 25 MG PO CAPS
25.0000 mg | ORAL_CAPSULE | Freq: Once | ORAL | Status: AC
  Administered 2018-02-09: 10:00:00 25 mg via ORAL
  Filled 2018-02-09: qty 1

## 2018-02-09 MED ORDER — PEG 3350-KCL-NA BICARB-NACL 420 G PO SOLR
4000.0000 mL | Freq: Once | ORAL | Status: AC
  Administered 2018-02-09: 20:00:00 4000 mL via ORAL
  Filled 2018-02-09: qty 4000

## 2018-02-09 NOTE — Consult Note (Signed)
Hematology/Oncology Consult note [facility] [phone] [phone]  Patient Care Team: Hortencia Pilar, MD as PCP - General (Family Medicine)   Name of the patient: Tina Hurst  [id number]  10/18/1960   Date of visit: 02/09/18 REASON FOR COSULTATION:   History of presenting illness-  57 y.o. female with PMH listed at below who presents to ER for evaluation of severe generalized weakness and fatigue and abdominal labs.   Also associated with shortness of breath with moderate exertion for the past 2 months. Blood work-up done at emergency room remarkable for hemoglobin level of 6.5, Reviewed patient's previous CBC however in EMR.  She has CBC done in 2016 which showed a hemoglobin of 12.1.  At that time MCV was 100. PLast colonoscopy was done 3 years ago which showed diverticulosis.  Patient has a history of IBS. Denies hematochezia, hematuria, hematemesis, epistaxis, easy bruising.  Denies any weight loss fever or chills. Occasional NSAID use. Mentioned that she had some epigastric pain 2 weeks ago.  Reports stool is dark for a while. Drinks wine or tequila 2 to 3 times a week on average.  Last menstrual period was 20 years ago. Remote history of vitamin B12 deficiency, status post parenteral B12 injections a long time ago.  Currently not on any supplements. Denies any family history of cancer. atient status post 2 PRBC transfusion.  Was also seen by gastroenterology plan for EGD.  Review of Systems  Constitutional: Positive for fatigue. Negative for appetite change, chills, fever and unexpected weight change.  HENT:   Negative for hearing loss and voice change.   Eyes: Negative for eye problems.  Respiratory: Positive for shortness of breath. Negative for chest tightness and cough.   Cardiovascular: Negative for chest pain.  Gastrointestinal: Negative for abdominal distention, abdominal pain and blood in stool.  Genitourinary: Negative  for difficulty urinating and frequency.   Musculoskeletal: Negative for arthralgias.  Skin: Negative for itching and rash.  Neurological: Negative for extremity weakness.  Hematological: Negative for adenopathy.  Psychiatric/Behavioral: Negative for confusion.    Allergies  Allergen Reactions  . Augmentin [Amoxicillin-Pot Clavulanate] Nausea And Vomiting and Other (See Comments)    Pt states that she is able to take 250mg  tablet.    . Amlodipine Swelling and Palpitations  . Ginger Rash  . Sulfa Antibiotics Rash    Patient Active Problem List   Diagnosis Date Noted  . Severe anemia 02/08/2018  . Diarrhea   . Diverticulitis of large intestine without perforation or abscess without bleeding   . Benign neoplasm of ascending colon   . Benign neoplasm of transverse colon   . Diverticulitis 11/01/2014  . HTN (hypertension) 11/01/2014  . IBS (irritable bowel syndrome) 11/01/2014  . Hyperglycemia 11/01/2014  . Enthesopathy of hip 08/04/2013  . 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  . Can't get food down 08/20/2011  . Benign essential hypertension 05/14/2011     Past Medical History:  Diagnosis Date  . Diverticulosis   . GERD (gastroesophageal reflux disease)   . Hyperglycemia   . Hypertension   . IBS (irritable bowel syndrome)   . Mitral valve prolapse    followed by PCP  . Psoriasis   . Wears contact lenses      Past Surgical History:  Procedure Laterality Date  . CHOLECYSTECTOMY    . COLONOSCOPY WITH PROPOFOL N/A 01/05/2015   Procedure: COLONOSCOPY WITH PROPOFOL;  Surgeon: Lucilla Lame, MD;  Location: [place] [facility];  Service: Endoscopy;  Laterality: N/A;  .  LAPAROSCOPIC TUBAL LIGATION    . POLYPECTOMY  01/05/2015   Procedure: POLYPECTOMY;  Surgeon: Lucilla Lame, MD;  Location: [facility];  Service: Endoscopy;;    Social History   Socioeconomic History  . Marital status: Married    Spouse name: Not on file  . Number of children: Not on file  . Years of  education: Not on file  . Highest education level: Not on file  Occupational History  . Not on file  Social Needs  . Financial resource strain: Not on file  . Food insecurity:    Worry: Not on file    Inability: Not on file  . Transportation needs:    Medical: Not on file    Non-medical: Not on file  Tobacco Use  . Smoking status: Former Research scientist (life sciences)  . Smokeless tobacco: Never Used  . Tobacco comment: quit about 10 yrs ago  Substance and Sexual Activity  . Alcohol use: Yes    Alcohol/week: 3.0 standard drinks    Types: 1 Glasses of wine, 1 Cans of beer, 1 Shots of liquor per week    Comment: occassional - 4 drinks a week  . Drug use: No  . Sexual activity: Not on file  Lifestyle  . Physical activity:    Days per week: Not on file    Minutes per session: Not on file  . Stress: Not on file  Relationships  . Social connections:    Talks on phone: Not on file    Gets together: Not on file    Attends religious service: Not on file    Active member of club or organization: Not on file    Attends meetings of clubs or organizations: Not on file    Relationship status: Not on file  . Intimate partner violence:    Fear of current or ex partner: Not on file    Emotionally abused: Not on file    Physically abused: Not on file    Forced sexual activity: Not on file  Other Topics Concern  . Not on file  Social History Narrative  . Not on file     Family History  Problem Relation Age of Onset  . Irritable bowel syndrome Sister   . Hypertension Mother   . Diabetes Mother   . Hypertension Father   . Alcohol abuse Father      Current Facility-Administered Medications:  .  0.9 %  sodium chloride infusion (Manually program via Guardrails IV Fluids), , Intravenous, Once, Salary, Montell D, MD .  0.9 %  sodium chloride infusion, , Intravenous, Continuous, Tahiliani, Varnita B, MD .  acetaminophen (TYLENOL) tablet 650 mg, 650 mg, Oral, Q6H PRN **OR** acetaminophen (TYLENOL)  suppository 650 mg, 650 mg, Rectal, Q6H PRN, Amelia Jo, MD .  albuterol (PROVENTIL) (2.5 MG/3ML) 0.083% nebulizer solution 3 mL, 3 mL, Inhalation, Q4H PRN, Amelia Jo, MD .  alosetron (LOTRONEX) tablet 1 mg, 1 mg, Oral, BID, Amelia Jo, MD .  bisacodyl (DULCOLAX) EC tablet 5 mg, 5 mg, Oral, Daily PRN, Amelia Jo, MD .  calcium carbonate (TUMS - dosed in mg elemental calcium) chewable tablet 200 mg of elemental calcium, 1 tablet, Oral, PRN, Amelia Jo, MD .  carvedilol (COREG) tablet 25 mg, 25 mg, Oral, BID, Amelia Jo, MD, 25 mg at 02/09/18 0931 .  cholecalciferol (VITAMIN D3) tablet 1,000 Units, 1,000 Units, Oral, q morning - 10a, Amelia Jo, MD, 1,000 Units at 02/09/18 [phone] .  docusate sodium (COLACE) capsule 100 mg, 100  mg, Oral, BID, Amelia Jo, MD .  HYDROcodone-acetaminophen (NORCO/VICODIN) 5-325 MG per tablet 1-2 tablet, 1-2 tablet, Oral, Q4H PRN, Amelia Jo, MD .  Margrett Rud iron dextran complex (INFED) 25 mg in sodium chloride 0.9 % 50 mL IVPB, 25 mg, Intravenous, Once, Last Rate: 600 mL/hr at 02/09/18 1128, 25 mg at 02/09/18 1128 **FOLLOWED BY** iron dextran complex (INFED) 100 mg in sodium chloride 0.9 % 100 mL IVPB, 100 mg, Intravenous, Once, Salary, Montell D, MD .  Lifitegrast 5 % SOLN 1 drop, 1 drop, Both Eyes, q morning - 10a, Amelia Jo, MD .  losartan (COZAAR) tablet 100 mg, 100 mg, Oral, Daily, Amelia Jo, MD, 100 mg at 02/09/18 7510 .  multivitamin with minerals tablet 1 tablet, 1 tablet, Oral, Daily, Amelia Jo, MD, 1 tablet at 02/09/18 [phone] .  ondansetron (ZOFRAN) tablet 4 mg, 4 mg, Oral, Q6H PRN **OR** ondansetron (ZOFRAN) injection 4 mg, 4 mg, Intravenous, Q6H PRN, Amelia Jo, MD .  pantoprazole (PROTONIX) injection 40 mg, 40 mg, Intravenous, Q12H, Tahiliani, Varnita B, MD, 40 mg at 02/09/18 0947 .  traZODone (DESYREL) tablet 25 mg, 25 mg, Oral, QHS PRN, Amelia Jo, MD   Physical exam:  Vitals:   02/09/18 0608 02/09/18 0630  02/09/18 1000 02/09/18 1044  BP: (!) 166/89 (!) 160/83 (!) 151/86 (!) 145/78  Pulse: 88 80 91 80  Resp: 20 20 18 20   Temp: 98.7 F (37.1 C) 98.1 F (36.7 C) 99 F (37.2 C) 98.5 F (36.9 C)  TempSrc: Oral Oral Oral Oral  SpO2: 99% 96%  95%  Weight:      Height:       Physical Exam  Constitutional: She is oriented to person, place, and time. No distress.  HENT:  Head: Normocephalic and atraumatic.  Nose: Nose normal.  Mouth/Throat: Oropharynx is clear and moist. No oropharyngeal exudate.  Eyes: Pupils are equal, round, and reactive to light. EOM are normal. No scleral icterus.  Pale conjunctivae  Neck: Normal range of motion. Neck supple.  Cardiovascular: Normal rate and regular rhythm.  No murmur heard. Pulmonary/Chest: Effort normal. No respiratory distress. She has no rales. She exhibits no tenderness.  Abdominal: Soft. She exhibits no distension. There is no tenderness.  Musculoskeletal: Normal range of motion.  Trace edema bilateral lower extremities  Neurological: She is alert and oriented to person, place, and time. No cranial nerve deficit. She exhibits normal muscle tone. Coordination normal.  Skin: Skin is warm and dry. She is not diaphoretic. No erythema.  Psychiatric: Affect normal.        CMP Latest Ref Rng & Units 02/09/2018  Glucose 70 - 99 mg/dL 125(H)  BUN 6 - 20 mg/dL 12  Creatinine 0.44 - 1.00 mg/dL 0.71  Sodium 135 - 145 mmol/L 140  Potassium 3.5 - 5.1 mmol/L 3.6  Chloride 98 - 111 mmol/L 106  CO2 22 - 32 mmol/L 25  Calcium 8.9 - 10.3 mg/dL 8.4(L)  Total Protein 6.5 - 8.1 g/dL -  Total Bilirubin 0.3 - 1.2 mg/dL -  Alkaline Phos 38 - 126 U/L -  AST 15 - 41 U/L -  ALT 14 - 54 U/L -   CBC Latest Ref Rng & Units 02/09/2018  WBC 4.0 - 10.5 K/uL -  Hemoglobin 12.0 - 15.0 g/dL 8.4(L)  Hematocrit 36.0 - 46.0 % 28.5(L)  Platelets 150 - 400 K/uL -   RADIOGRAPHIC STUDIES: I have personally reviewed the radiological images as listed and agreed with the  findings in the report.  Dg Chest 2 View  Result Date: 02/08/2018 CLINICAL DATA:  Shortness of breath and weakness for couple of weeks. EXAM: CHEST - 2 VIEW COMPARISON:  None. FINDINGS: Cardiomediastinal silhouette is normal. Mediastinal contours appear intact. There is no evidence of focal airspace consolidation, pleural effusion or pneumothorax. Osseous structures are without acute abnormality. Soft tissues are grossly normal. IMPRESSION: No active cardiopulmonary disease. Electronically Signed   By: Fidela Salisbury M.D.   On: 02/08/2018 19:31    Assessment and plan- Patient is a 57 y.o. female with history of IBS, hypertension, hyperglycemia, GERD, diverticulosis, mitral valve prolapse, psoriasis presented to emergency room for evaluation of profound weakness found to be severe anemic.  #Iron deficiency anemia,  Labs reviewed and discussed with patient.  Ferritin 3, with decreased saturation ratio, and elevated TIBC.  Consistent with severe iron deficiency. Agree with GI recommendation.  EGD today or tomorrow.  Currently patient is n.p.o. Continue PPI Patient has already received INFeD 25 mg x 1, appears tolerating well.  She is scheduled to receive INFeD 100 mg again. . She can follow-up with me outpatient and receive additional IV iron. #Symptomatic anemia, status post 2 unit PRBC transfusion.  Clinically improved.  Posttransfusion hemoglobin improved appropriately.  #History of B12 deficiency, B12 level 469.  Normal.  Thank you for allowing me to participate in the care of this patient.  Total face to face encounter time for this patient visit was 70 min. >50% of the time was  spent in counseling and coordination of care.    Earlie Server, MD, PhD Hematology Oncology [facility] Pager- [phone] 02/09/2018

## 2018-02-09 NOTE — Consult Note (Signed)
Tina Antigua, MD [address] [place], [place], [place], [address] [address], [place], [place], [place], [address] Phone: [phone]  Fax: [phone]  Consultation  Referring Provider:     Dr. Jerelyn Charles Primary Care Physician:  Tina Pilar, MD Reason for Consultation:     Anemia Primary gastroenterologist: Dr. Allen Norris  Date of Admission:  02/08/2018 Date of Consultation:  02/09/2018         HPI:   Tina Hurst is a 57 y.o. female with 2 to 42-month history of fatigue, increased dyspnea on exertion, sent to the hospital by her primary care provider due to hemoglobin around 6.5.  Patient denies any sources of active bleeding, no epistaxis, no vaginal bleeding, no hematuria, no hematochezia or melena, no hematemesis.  Patient denies any nausea vomiting.  Denies any diarrhea.  Reports occasional ibuprofen use, last use was 2 weeks ago, and states only uses it about once a month or once every other month.  Last seen by Dr. Allen Norris in October 2018, with history of irritable bowel syndrome.  His note mentions that she had a colonoscopy in October 2016 with a 5 mm tubular removed from the ascending colon, and random colon biopsies did not show inflammation or colitis.  Terminal ileum biopsies did not show colitis as well.  Diverticulosis was reported.  No prior EGDs.  Patient reports remote history of pernicious anemia that was diagnosed in [place] years ago and she was on B12 replacement, with injections as well.  She states she has not had this in years, when she moved to [place], her primary care provider did further testing and stated she does not need to be on B12.  We do not have any records in care everywhere or in her chart about this.  Past Medical History:  Diagnosis Date  . Diverticulosis   . GERD (gastroesophageal reflux disease)   . Hyperglycemia   . Hypertension   . IBS (irritable bowel syndrome)   . Mitral valve prolapse    followed by PCP  . Psoriasis    . Wears contact lenses     Past Surgical History:  Procedure Laterality Date  . CHOLECYSTECTOMY    . COLONOSCOPY WITH PROPOFOL N/A 01/05/2015   Procedure: COLONOSCOPY WITH PROPOFOL;  Surgeon: Lucilla Lame, MD;  Location: [facility];  Service: Endoscopy;  Laterality: N/A;  . LAPAROSCOPIC TUBAL LIGATION    . POLYPECTOMY  01/05/2015   Procedure: POLYPECTOMY;  Surgeon: Lucilla Lame, MD;  Location: [facility];  Service: Endoscopy;;    Prior to Admission medications   Medication Sig Start Date End Date Taking? Authorizing Provider  losartan-hydrochlorothiazide (HYZAAR) 100-12.5 MG tablet Take 1 tablet by mouth daily.   Yes [provider]  albuterol (PROAIR HFA) 108 (90 Base) MCG/ACT inhaler Inhale into the lungs. 02/07/15 02/07/16  [provider]  alosetron (LOTRONEX) 1 MG tablet Take 1 tablet (1 mg total) by mouth 2 (two) times daily. 11/26/17   Lucilla Lame, MD  Calcium Carb-Cholecalciferol (CALCIUM 600 + D PO) Take 1 tablet by mouth daily.     [provider]  calcium carbonate (TUMS - DOSED IN MG ELEMENTAL CALCIUM) 500 MG chewable tablet Chew 1 tablet by mouth as needed for indigestion or heartburn.    [provider]  carvedilol (COREG) 25 MG tablet Take 25 mg by mouth 2 (two) times daily.    [provider]  Cholecalciferol (VITAMIN D-1000 MAX ST) 1000 units tablet Take by mouth.  [provider]  CHROMIUM PO 1 po daily 07/07/12   [provider]  Lorcaserin HCl ER (BELVIQ XR) 20 MG TB24 Take by mouth. 11/20/16   [provider]  losartan (COZAAR) 100 MG tablet Take 100 mg by mouth daily.    [provider]  Multiple Vitamin (MULTIVITAMIN WITH MINERALS) TABS tablet Take 1 tablet by mouth daily.    [provider]  NEOMYCIN-POLYMYXIN-HYDROCORTISONE (CORTISPORIN) 1 % SOLN otic solution Apply 1-2 drops to toe BID after soaking Patient not taking: Reported on 12/31/2015 04/11/15   Hyatt,  Max T, DPM  phentermine (ADIPEX-P) 37.5 MG tablet Take by mouth.    [provider]  PRESCRIPTION MEDICATION Take 1 capsule by mouth daily. Pt takes a compounded estrogen capsule.  Biestrogen (80/20) and Progesterone 0.4mg /50mg .    [provider]  XIIDRA 5 % SOLN USE 1 DROP IN BOTH EYES 2 TIMES A DAY. 10/02/16   [provider]    Family History  Problem Relation Age of Onset  . Irritable bowel syndrome Sister   . Hypertension Mother   . Diabetes Mother   . Hypertension Father   . Alcohol abuse Father      Social History   Tobacco Use  . Smoking status: Former Research scientist (life sciences)  . Smokeless tobacco: Never Used  . Tobacco comment: quit about 10 yrs ago  Substance Use Topics  . Alcohol use: Yes    Alcohol/week: 3.0 standard drinks    Types: 1 Glasses of wine, 1 Cans of beer, 1 Shots of liquor per week    Comment: occassional - 4 drinks a week  . Drug use: No    Allergies as of 02/08/2018 - Review Complete 02/08/2018  Allergen Reaction Noted  . Augmentin [amoxicillin-pot clavulanate] Nausea And Vomiting and Other (See Comments) 10/29/2014  . Amlodipine Swelling and Palpitations 06/23/2012  . Ginger Rash 12/29/2014  . Sulfa antibiotics Rash 10/29/2014    Review of Systems:    All systems reviewed and negative except where noted in HPI.   Physical Exam:  Vital signs in last 24 hours: Vitals:   02/09/18 0608 02/09/18 0630 02/09/18 1000 02/09/18 1044  BP: (!) 166/89 (!) 160/83 (!) 151/86 (!) 145/78  Pulse: 88 80 91 80  Resp: 20 20 18 20   Temp: 98.7 F (37.1 C) 98.1 F (36.7 C) 99 F (37.2 C) 98.5 F (36.9 C)  TempSrc: Oral Oral Oral Oral  SpO2: 99% 96%  95%  Weight:      Height:       Last BM Date: 02/08/18 General:   Pleasant, cooperative in NAD Head:  Normocephalic and atraumatic. Eyes:   No icterus.   Conjunctiva pink. PERRLA. Ears:  Normal auditory acuity. Neck:  Supple; no masses or thyroidomegaly Lungs: Respirations even and unlabored.  Lungs clear to auscultation bilaterally.   No wheezes, crackles, or rhonchi.  Abdomen:  Soft, nondistended, nontender. Normal bowel sounds. No appreciable masses or hepatomegaly.  No rebound or guarding.  Neurologic:  Alert and oriented x3;  grossly normal neurologically. Skin:  Intact without significant lesions or rashes. Cervical Nodes:  No significant cervical adenopathy. Psych:  Alert and cooperative. Normal affect.  LAB RESULTS: Recent Labs    02/08/18 1832 02/09/18 0440 02/09/18 1052  WBC 5.1 4.8  --   HGB 6.5* 6.7* 8.4*  HCT 24.0* 23.6* 28.5*  PLT 286 229  --    BMET Recent Labs    02/08/18 1832 02/09/18 0440  NA 149* 140  K  4.6 3.6  CL 113* 106  CO2 29 25  GLUCOSE 103* 125*  BUN 13 12  CREATININE 0.68 0.71  CALCIUM 8.7* 8.4*   LFT No results for input(s): PROT, ALBUMIN, AST, ALT, ALKPHOS, BILITOT, BILIDIR, IBILI in the last 72 hours. PT/INR Recent Labs    02/09/18 1052  LABPROT 14.1  INR 1.10    STUDIES: Dg Chest 2 View  Result Date: 02/08/2018 CLINICAL DATA:  Shortness of breath and weakness for couple of weeks. EXAM: CHEST - 2 VIEW COMPARISON:  None. FINDINGS: Cardiomediastinal silhouette is normal. Mediastinal contours appear intact. There is no evidence of focal airspace consolidation, pleural effusion or pneumothorax. Osseous structures are without acute abnormality. Soft tissues are grossly normal. IMPRESSION: No active cardiopulmonary disease. Electronically Signed   By: Fidela Salisbury M.D.   On: 02/08/2018 19:31      Impression / Plan:   Maleeya Peterkin is a 57 y.o. y/o female with acute microcytic anemia with no active bleeding  Source of anemia unclear given no active bleeding Patient reports occasional NSAID use We will start with EGD to rule out any esophagitis, gastritis, gastric ulcers that could be leading to her anemia.  Her colonoscopy was only 3 years ago, therefore a new colon malignancy is less likely.  However, if EGD is  negative colonoscopy would be indicated.  PPI IV twice daily  Continue serial CBCs and transfuse PRN Avoid NSAIDs Maintain 2 large-bore IV lines Please page GI with any acute hemodynamic changes, or signs of active GI bleeding  She reports a remote history of pernicious anemia for which we do not have any records as she states it was years ago in [place] and she was on B12 replacement.  This will likely need to be obtained as an outpatient GI clinic  EGD today or tomorrow Continue n.p.o. Pt hemodynamically stable at this time.   I have discussed alternative options, risks & benefits,  which include, but are not limited to, bleeding, infection, perforation,respiratory complication & drug reaction.  The patient agrees with this plan & written consent will be obtained.      Thank you for involving me in the care of this patient.      LOS: 1 day   Virgel Manifold, MD  02/09/2018, 11:55 AM

## 2018-02-09 NOTE — Progress Notes (Signed)
[facility] NAME: Tina Hurst    MR#:  [id number]  DATE OF BIRTH:  December 10, 1960  SUBJECTIVE:  CHIEF COMPLAINT:   Patient without complaint, gastroenterology input appreciated-for possible EGD/colonoscopy today or tomorrow  REVIEW OF SYSTEMS:  CONSTITUTIONAL: No fever, fatigue or weakness.  EYES: No blurred or double vision.  EARS, NOSE, AND THROAT: No tinnitus or ear pain.  RESPIRATORY: No cough, shortness of breath, wheezing or hemoptysis.  CARDIOVASCULAR: No chest pain, orthopnea, edema.  GASTROINTESTINAL: No nausea, vomiting, diarrhea or abdominal pain.  GENITOURINARY: No dysuria, hematuria.  ENDOCRINE: No polyuria, nocturia,  HEMATOLOGY: No anemia, easy bruising or bleeding SKIN: No rash or lesion. MUSCULOSKELETAL: No joint pain or arthritis.   NEUROLOGIC: No tingling, numbness, weakness.  PSYCHIATRY: No anxiety or depression.   ROS  DRUG ALLERGIES:   Allergies  Allergen Reactions  . Augmentin [Amoxicillin-Pot Clavulanate] Nausea And Vomiting and Other (See Comments)    Pt states that she is able to take 250mg  tablet.    . Amlodipine Swelling and Palpitations  . Ginger Rash  . Sulfa Antibiotics Rash    VITALS:  Blood pressure (!) 145/78, pulse 80, temperature 98.5 F (36.9 C), temperature source Oral, resp. rate 20, height 5\' 10"  (1.778 m), weight 116.8 kg, SpO2 95 %.  PHYSICAL EXAMINATION:  GENERAL:  57 y.o.-year-old patient lying in the bed with no acute distress.  EYES: Pupils equal, round, reactive to light and accommodation. No scleral icterus. Extraocular muscles intact.  HEENT: Head atraumatic, normocephalic. Oropharynx and nasopharynx clear.  NECK:  Supple, no jugular venous distention. No thyroid enlargement, no tenderness.  LUNGS: Normal breath sounds bilaterally, no wheezing, rales,rhonchi or crepitation. No use of accessory muscles of respiration.  CARDIOVASCULAR: S1, S2 normal. No murmurs, rubs, or  gallops.  ABDOMEN: Soft, nontender, nondistended. Bowel sounds present. No organomegaly or mass.  EXTREMITIES: No pedal edema, cyanosis, or clubbing.  NEUROLOGIC: Cranial nerves II through XII are intact. Muscle strength 5/5 in all extremities. Sensation intact. Gait not checked.  PSYCHIATRIC: The patient is alert and oriented x 3.  SKIN: No obvious rash, lesion, or ulcer.   Physical Exam LABORATORY PANEL:   CBC Recent Labs  Lab 02/09/18 0440 02/09/18 1052  WBC 4.8  --   HGB 6.7* 8.4*  HCT 23.6* 28.5*  PLT 229  --    ------------------------------------------------------------------------------------------------------------------  Chemistries  Recent Labs  Lab 02/09/18 0440  NA 140  K 3.6  CL 106  CO2 25  GLUCOSE 125*  BUN 12  CREATININE 0.71  CALCIUM 8.4*   ------------------------------------------------------------------------------------------------------------------  Cardiac Enzymes Recent Labs  Lab 02/08/18 1832  TROPONINI <0.03   ------------------------------------------------------------------------------------------------------------------  RADIOLOGY:  Dg Chest 2 View  Result Date: 02/08/2018 CLINICAL DATA:  Shortness of breath and weakness for couple of weeks. EXAM: CHEST - 2 VIEW COMPARISON:  None. FINDINGS: Cardiomediastinal silhouette is normal. Mediastinal contours appear intact. There is no evidence of focal airspace consolidation, pleural effusion or pneumothorax. Osseous structures are without acute abnormality. Soft tissues are grossly normal. IMPRESSION: No active cardiopulmonary disease. Electronically Signed   By: Fidela Salisbury M.D.   On: 02/08/2018 19:31    ASSESSMENT AND PLAN:  *Acute symptomatic severe microcytic anemia Anemia work-up noted for iron deficiency, noted history of diverticular disease We will give IV iron, ferrous sulfate, vitamin C, gastroenterology input appreciated-for possible EGD/colonoscopy today or tomorrow,  currently receiving blood transfusion-2 units, CBC daily, guaiac all stools, and check CBC in the morning   *  Chronic benign essential hypertension  Stable on current regiment   Disposition pending clinical course   All the records are reviewed and case discussed with Care Management/Social Workerr. Management plans discussed with the patient, family and they are in agreement.  CODE STATUS: full  TOTAL TIME TAKING CARE OF THIS PATIENT: 45 minutes.     POSSIBLE D/C IN 1-2 DAYS, DEPENDING ON CLINICAL CONDITION.   Avel Peace Salary M.D on 02/09/2018   Between 7am to 6pm - Pager - [phone]  After 6pm go to [email or web address] - password [email or web address] [facility]  Office  [phone]  CC: Primary care physician; Hortencia Pilar, MD  Note: This dictation was prepared with Dragon dictation along with smaller phrase technology. Any transcriptional errors that result from this process are unintentional.

## 2018-02-09 NOTE — Transfer of Care (Signed)
Immediate Anesthesia Transfer of Care Note  Patient: Tina Hurst  Procedure(s) Performed: ESOPHAGOGASTRODUODENOSCOPY (EGD) (N/A )  Patient Location: PACU  Anesthesia Type:General  Level of Consciousness: sedated  Airway & Oxygen Therapy: Patient Spontanous Breathing and Patient connected to nasal cannula oxygen  Post-op Assessment: Report given to RN and Post -op Vital signs reviewed and stable  Post vital signs: Reviewed and stable  Last Vitals:  Vitals Value Taken Time  BP    Temp    Pulse 86 02/09/2018  4:39 PM  Resp 18 02/09/2018  4:39 PM  SpO2 95 % 02/09/2018  4:39 PM  Vitals shown include unvalidated device data.  Last Pain:  Vitals:   02/09/18 1531  TempSrc:   PainSc: 0-No pain         Complications: No apparent anesthesia complications

## 2018-02-09 NOTE — Care Management (Signed)
Patient with  anemia requiring 2 units of packed cells getting hemoglobin up to 8.4. GI and oncology following.  Iron infusion. Guaiac pending. EGD perform.  Colonoscopy 11/27. No discharge needs identified by members of the care team during rounds

## 2018-02-09 NOTE — Op Note (Signed)
[facility] Gastroenterology Patient Name: Tina Hurst Procedure Date: 02/09/2018 4:21 PM MRN: [id number] Account #: [id number] Date of Birth: 1960/06/01 Admit Type: Inpatient Age: 57 Room: [facility] ENDO ROOM 4 Gender: Female Note Status: Finalized Procedure:            Upper GI endoscopy Indications:          Anemia Providers:            Avien Taha B. Bonna Gains MD, MD Referring MD:         Kerin Perna MD, MD (Referring MD) Medicines:            Monitored Anesthesia Care Complications:        No immediate complications. Procedure:            Pre-Anesthesia Assessment:                       - Prior to the procedure, a History and Physical was                        performed, and patient medications, allergies and                        sensitivities were reviewed. The patient's tolerance of                        previous anesthesia was reviewed.                       - The risks and benefits of the procedure and the                        sedation options and risks were discussed with the                        patient. All questions were answered and informed                        consent was obtained.                       - Patient identification and proposed procedure were                        verified prior to the procedure by the physician, the                        nurse, the anesthesiologist, the anesthetist and the                        technician. The procedure was verified in the procedure                        room.                       - ASA Grade Assessment: II - A patient with mild                        systemic disease.  After obtaining informed consent, the endoscope was                        passed under direct vision. Throughout the procedure,                        the patient's blood pressure, pulse, and oxygen                        saturations were monitored continuously. The Endoscope     was introduced through the mouth, and advanced to the                        third part of duodenum. The upper GI endoscopy was                        accomplished with ease. The patient tolerated the                        procedure well. Findings:      The examined esophagus was normal.      One tongue of salmon-colored mucosa was present. No other visible       abnormalities were present. The maximum longitudinal extent of these       esophageal mucosal changes was 1 cm in length. This was not biopsied due       to patient's anemia on this admission.      A small hiatal hernia was present.      The entire examined stomach was normal.      The duodenal bulb, second portion of the duodenum, third portion of the       duodenum and examined duodenum were normal. Impression:           - Normal esophagus.                       - Salmon-colored mucosa suspicious for short-segment                        Barrett's esophagus.                       - Small hiatal hernia.                       - Normal stomach.                       - Normal duodenal bulb, second portion of the duodenum,                        third portion of the duodenum and examined duodenum.                       - No specimens collected.                       - No evidence of active or recent bleeding seen                        throughout the exam. Recommendation:       - Check Ferritin, Iron, Folate and B12 studies today.                       -  Perform a colonoscopy tomorrow.                       - Continue Serial CBCs and transfuse PRN                       - Clear liquid diet.                       - Continue present medications.                       - Patient has a contact number available for                        emergencies. The signs and symptoms of potential                        delayed complications were discussed with the patient.                        Return to normal activities tomorrow. Written  discharge                        instructions were provided to the patient.                       - The findings and recommendations were discussed with                        the patient.                       - The findings and recommendations were discussed with                        the patient's family. Procedure Code(s):    --- Professional ---                       [phone], Esophagogastroduodenoscopy, flexible, transoral;                        diagnostic, including collection of specimen(s) by                        brushing or washing, when performed (separate procedure) Diagnosis Code(s):    --- Professional ---                       K22.8, Other specified diseases of esophagus                       K44.9, Diaphragmatic hernia without obstruction or                        gangrene                       D64.9, Anemia, unspecified CPT copyright 2018 American Medical Association. All rights reserved. The codes documented in this report are preliminary and upon coder review may  be revised to meet current compliance requirements.  [name], MD Margretta Sidle B. Bonna Gains MD, MD 02/09/2018 4:41:49 PM This report has been signed electronically.  Number of Addenda: 0 Note Initiated On: 02/09/2018 4:21 PM Estimated Blood Loss: Estimated blood loss: none.      [facility]

## 2018-02-09 NOTE — Plan of Care (Signed)

## 2018-02-09 NOTE — Anesthesia Preprocedure Evaluation (Signed)
Anesthesia Evaluation  Patient identified by MRN, date of birth, ID band Patient awake    Reviewed: Allergy & Precautions, NPO status , Patient's Chart, lab work & pertinent test results, reviewed documented beta blocker date and time   History of Anesthesia Complications Negative for: history of anesthetic complications  Airway Mallampati: III       Dental   Pulmonary neg sleep apnea, neg COPD, former smoker,           Cardiovascular hypertension, Pt. on medications and Pt. on home beta blockers (-) Past MI and (-) CHF (-) dysrhythmias + Valvular Problems/Murmurs MVP      Neuro/Psych neg Seizures    GI/Hepatic Neg liver ROS, GERD  Medicated and Controlled,  Endo/Other  diabetes (borderline)  Renal/GU negative Renal ROS     Musculoskeletal   Abdominal   Peds  Hematology  (+) anemia ,   Anesthesia Other Findings   Reproductive/Obstetrics                             Anesthesia Physical Anesthesia Plan  ASA: II  Anesthesia Plan: General   Post-op Pain Management:    Induction:   PONV Risk Score and Plan: 3 and Propofol infusion, TIVA and Midazolam  Airway Management Planned: Nasal Cannula  Additional Equipment:   Intra-op Plan:   Post-operative Plan:   Informed Consent: I have reviewed the patients History and Physical, chart, labs and discussed the procedure including the risks, benefits and alternatives for the proposed anesthesia with the patient or authorized representative who has indicated his/her understanding and acceptance.     Plan Discussed with:   Anesthesia Plan Comments:         Anesthesia Quick Evaluation

## 2018-02-09 NOTE — Anesthesia Post-op Follow-up Note (Signed)
Anesthesia QCDR form completed.        

## 2018-02-09 NOTE — Anesthesia Postprocedure Evaluation (Signed)
Anesthesia Post Note  Patient: Tina Hurst  Procedure(s) Performed: ESOPHAGOGASTRODUODENOSCOPY (EGD) (N/A )  Patient location during evaluation: Endoscopy Anesthesia Type: General Level of consciousness: awake and alert Pain management: pain level controlled Vital Signs Assessment: post-procedure vital signs reviewed and stable Respiratory status: spontaneous breathing and respiratory function stable Cardiovascular status: stable Anesthetic complications: no     Last Vitals:  Vitals:   02/09/18 1649 02/09/18 1659  BP: 138/77 (!) 152/82  Pulse: 83 78  Resp: 14 14  Temp:    SpO2: 99% 97%    Last Pain:  Vitals:   02/09/18 1659  TempSrc:   PainSc: 0-No pain                 Lind Ausley K

## 2018-02-09 NOTE — Anesthesia Procedure Notes (Signed)
Date/Time: 02/09/2018 4:32 PM Performed by: Nelda Marseille, CRNA Pre-anesthesia Checklist: Patient identified, Emergency Drugs available, Suction available, Patient being monitored and Timeout performed Oxygen Delivery Method: Nasal cannula

## 2018-02-10 ENCOUNTER — Encounter: Payer: Self-pay | Admitting: Gastroenterology

## 2018-02-10 ENCOUNTER — Observation Stay: Payer: 59 | Admitting: Anesthesiology

## 2018-02-10 ENCOUNTER — Encounter
Admission: EM | Disposition: A | Discharge status: Home / Self Care | Payer: Self-pay | Source: Home / Self Care | Attending: Emergency Medicine

## 2018-02-10 DIAGNOSIS — K635 Polyp of colon: Secondary | ICD-10-CM

## 2018-02-10 DIAGNOSIS — K5731 Diverticulosis of large intestine without perforation or abscess with bleeding: Secondary | ICD-10-CM

## 2018-02-10 DIAGNOSIS — D120 Benign neoplasm of cecum: Secondary | ICD-10-CM | POA: Diagnosis not present

## 2018-02-10 DIAGNOSIS — D509 Iron deficiency anemia, unspecified: Secondary | ICD-10-CM | POA: Diagnosis not present

## 2018-02-10 DIAGNOSIS — D500 Iron deficiency anemia secondary to blood loss (chronic): Secondary | ICD-10-CM | POA: Diagnosis not present

## 2018-02-10 HISTORY — PX: COLONOSCOPY: SHX5424

## 2018-02-10 LAB — CBC
HCT: 28.4 % — ABNORMAL LOW (ref 36.0–46.0)
HEMOGLOBIN: 8.3 g/dL — AB (ref 12.0–15.0)
MCH: 21.9 pg — ABNORMAL LOW (ref 26.0–34.0)
MCHC: 29.2 g/dL — AB (ref 30.0–36.0)
MCV: 74.9 fL — ABNORMAL LOW (ref 80.0–100.0)
Platelets: 251 10*3/uL (ref 150–400)
RBC: 3.79 MIL/uL — AB (ref 3.87–5.11)
RDW: 18.2 % — ABNORMAL HIGH (ref 11.5–15.5)
WBC: 5.6 10*3/uL (ref 4.0–10.5)
nRBC: 0 % (ref 0.0–0.2)

## 2018-02-10 LAB — HEMOGLOBIN AND HEMATOCRIT, BLOOD
HEMATOCRIT: 27.4 % — AB (ref 36.0–46.0)
HEMATOCRIT: 27 % — AB (ref 36.0–46.0)
HEMOGLOBIN: 8 g/dL — AB (ref 12.0–15.0)
HEMOGLOBIN: 8 g/dL — AB (ref 12.0–15.0)

## 2018-02-10 LAB — SAMPLE TO BLOOD BANK

## 2018-02-10 LAB — HIV ANTIBODY (ROUTINE TESTING W REFLEX): HIV SCREEN 4TH GENERATION: NONREACTIVE

## 2018-02-10 SURGERY — COLONOSCOPY
Anesthesia: General | Laterality: Left

## 2018-02-10 MED ORDER — SODIUM CHLORIDE 0.9 % IV SOLN
700.0000 mg | Freq: Once | INTRAVENOUS | Status: AC
  Administered 2018-02-10: 700 mg via INTRAVENOUS
  Filled 2018-02-10: qty 14

## 2018-02-10 MED ORDER — SODIUM CHLORIDE 0.9 % IV BOLUS
500.0000 mL | Freq: Once | INTRAVENOUS | Status: AC
  Administered 2018-02-10: 500 mL via INTRAVENOUS

## 2018-02-10 MED ORDER — SODIUM CHLORIDE 0.9 % IV BOLUS
500.0000 mL | Freq: Once | INTRAVENOUS | Status: AC
  Administered 2018-02-10: 19:00:00 500 mL via INTRAVENOUS

## 2018-02-10 MED ORDER — SODIUM CHLORIDE 0.9 % IV SOLN
Freq: Once | INTRAVENOUS | Status: AC
  Administered 2018-02-10: 10:00:00 via INTRAVENOUS

## 2018-02-10 MED ORDER — PROPOFOL 10 MG/ML IV BOLUS
INTRAVENOUS | Status: DC | PRN
  Administered 2018-02-10: 50 mg via INTRAVENOUS

## 2018-02-10 MED ORDER — SODIUM CHLORIDE 0.9 % IV SOLN
900.0000 mg | Freq: Once | INTRAVENOUS | Status: DC
  Filled 2018-02-10: qty 18

## 2018-02-10 MED ORDER — PROPOFOL 500 MG/50ML IV EMUL
INTRAVENOUS | Status: AC
  Filled 2018-02-10: qty 50

## 2018-02-10 MED ORDER — FERROUS SULFATE 325 (65 FE) MG PO TABS: 325.0000 mg | ORAL_TABLET | Freq: Two times a day (BID) | ORAL | 1 refills | Status: DC

## 2018-02-10 MED ORDER — LIDOCAINE HCL (PF) 2 % IJ SOLN
INTRAMUSCULAR | Status: DC | PRN
  Administered 2018-02-10: 100 mg via INTRADERMAL

## 2018-02-10 MED ORDER — SODIUM CHLORIDE 0.9 % IV SOLN
INTRAVENOUS | Status: DC | PRN
  Administered 2018-02-10: 10:00:00 via INTRAVENOUS

## 2018-02-10 MED ORDER — PROPOFOL 500 MG/50ML IV EMUL
INTRAVENOUS | Status: DC | PRN
  Administered 2018-02-10: 130 ug/kg/min via INTRAVENOUS

## 2018-02-10 MED ORDER — SODIUM CHLORIDE 0.9 % IV BOLUS: 500.0000 mL | Freq: Once | INTRAVENOUS | Status: DC

## 2018-02-10 MED ORDER — PROPOFOL 10 MG/ML IV BOLUS
INTRAVENOUS | Status: AC
  Filled 2018-02-10: qty 20

## 2018-02-10 NOTE — Progress Notes (Signed)
After pt was able to have a clear liquid diet today I educated her on the meds that she missed this am while in procedures.She stated that she would just "want until tomorrow" to get any medicine besides the iron infusion and saline bolus that she wanted today before she left.. I marked as not given/ patient refused in [place] upon her request. Collier Bullock Rn

## 2018-02-10 NOTE — Progress Notes (Signed)
Hematology/Oncology Progress Note [facility] [phone] [phone]  Patient Care Team: Hortencia Pilar, MD as PCP - General (Family Medicine)   Name of the patient: Tina Hurst  [id number]  [birth year]  Date of visit: 02/10/18   INTERVAL HISTORY-  Status post colonoscopy today.  Capsule study in process. Reports feeling much better.  Fatigue level has significantly improved.  Denies any abdominal pain.      Review of systems- Review of Systems  Constitutional: Positive for fatigue. Negative for appetite change, chills and fever.  HENT:   Negative for hearing loss and voice change.   Eyes: Negative for eye problems.  Respiratory: Negative for chest tightness and cough.   Cardiovascular: Negative for chest pain.  Gastrointestinal: Negative for abdominal distention, abdominal pain and blood in stool.  Endocrine: Negative for hot flashes.  Genitourinary: Negative for difficulty urinating and frequency.   Musculoskeletal: Negative for arthralgias.  Skin: Negative for itching and rash.  Neurological: Negative for extremity weakness.  Hematological: Negative for adenopathy.  Psychiatric/Behavioral: Negative for confusion.    Allergies  Allergen Reactions  . Augmentin [Amoxicillin-Pot Clavulanate] Nausea And Vomiting and Other (See Comments)    Pt states that she is able to take 250mg  tablet.    . Amlodipine Swelling and Palpitations  . Ginger Rash  . Sulfa Antibiotics Rash    Patient Active Problem List   Diagnosis Date Noted  . Cecal polyp   . Diverticulosis of colon with hemorrhage   . Symptomatic anemia   . Iron deficiency anemia due to chronic blood loss   . Columnar epithelial-lined lower esophagus   . Hiatal hernia   . Severe anemia 02/08/2018  . Diarrhea   . Diverticulitis of large intestine without perforation or abscess without bleeding   . Benign neoplasm of ascending colon   . Benign neoplasm of transverse  colon   . Diverticulitis 11/01/2014  . HTN (hypertension) 11/01/2014  . IBS (irritable bowel syndrome) 11/01/2014  . Hyperglycemia 11/01/2014  . Enthesopathy of hip 08/04/2013  . 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  . Can't get food down 08/20/2011  . Benign essential hypertension 05/14/2011     Past Medical History:  Diagnosis Date  . Diverticulosis   . GERD (gastroesophageal reflux disease)   . Hyperglycemia   . Hypertension   . IBS (irritable bowel syndrome)   . Mitral valve prolapse    followed by PCP  . Psoriasis   . Wears contact lenses      Past Surgical History:  Procedure Laterality Date  . CHOLECYSTECTOMY    . COLONOSCOPY WITH PROPOFOL N/A 01/05/2015   Procedure: COLONOSCOPY WITH PROPOFOL;  Surgeon: Lucilla Lame, MD;  Location: [place];  Service: Endoscopy;  Laterality: N/A;  . ESOPHAGOGASTRODUODENOSCOPY N/A 02/09/2018   Procedure: ESOPHAGOGASTRODUODENOSCOPY (EGD);  Surgeon: Virgel Manifold, MD;  Location: [facility] ENDOSCOPY;  Service: Endoscopy;  Laterality: N/A;  . LAPAROSCOPIC TUBAL LIGATION    . POLYPECTOMY  01/05/2015   Procedure: POLYPECTOMY;  Surgeon: Lucilla Lame, MD;  Location: [place];  Service: Endoscopy;;    Social History   Socioeconomic History  . Marital status: Married    Spouse name: Not on file  . Number of children: Not on file  . Years of education: Not on file  . Highest education level: Not on file  Occupational History  . Not on file  Social Needs  . Financial resource strain: Not on file  . Food insecurity:    Worry: Not on  file    Inability: Not on file  . Transportation needs:    Medical: Not on file    Non-medical: Not on file  Tobacco Use  . Smoking status: Former Research scientist (life sciences)  . Smokeless tobacco: Never Used  . Tobacco comment: quit about 10 yrs ago  Substance and Sexual Activity  . Alcohol use: Yes    Alcohol/week: 3.0 standard drinks    Types: 1 Glasses of wine, 1 Cans of beer, 1 Shots of liquor per week     Comment: occassional - 4 drinks a week  . Drug use: No  . Sexual activity: Not on file  Lifestyle  . Physical activity:    Days per week: Not on file    Minutes per session: Not on file  . Stress: Not on file  Relationships  . Social connections:    Talks on phone: Not on file    Gets together: Not on file    Attends religious service: Not on file    Active member of club or organization: Not on file    Attends meetings of clubs or organizations: Not on file    Relationship status: Not on file  . Intimate partner violence:    Fear of current or ex partner: Not on file    Emotionally abused: Not on file    Physically abused: Not on file    Forced sexual activity: Not on file  Other Topics Concern  . Not on file  Social History Narrative  . Not on file     Family History  Problem Relation Age of Onset  . Irritable bowel syndrome Sister   . Hypertension Mother   . Diabetes Mother   . Hypertension Father   . Alcohol abuse Father      Current Facility-Administered Medications:  .  0.9 %  sodium chloride infusion (Manually program via Guardrails IV Fluids), , Intravenous, Once, Salary, Montell D, MD .  acetaminophen (TYLENOL) tablet 650 mg, 650 mg, Oral, Q6H PRN **OR** acetaminophen (TYLENOL) suppository 650 mg, 650 mg, Rectal, Q6H PRN, Amelia Jo, MD .  albuterol (PROVENTIL) (2.5 MG/3ML) 0.083% nebulizer solution 3 mL, 3 mL, Inhalation, Q4H PRN, Amelia Jo, MD .  alosetron (LOTRONEX) tablet 1 mg, 1 mg, Oral, BID, Amelia Jo, MD .  bisacodyl (DULCOLAX) EC tablet 5 mg, 5 mg, Oral, Daily PRN, Amelia Jo, MD .  calcium carbonate (TUMS - dosed in mg elemental calcium) chewable tablet 200 mg of elemental calcium, 1 tablet, Oral, PRN, Amelia Jo, MD .  carvedilol (COREG) tablet 25 mg, 25 mg, Oral, BID, Amelia Jo, MD, 25 mg at 02/09/18 2011 .  cholecalciferol (VITAMIN D3) tablet 1,000 Units, 1,000 Units, Oral, q morning - 10a, Amelia Jo, MD, 1,000 Units at  02/09/18 [phone] .  docusate sodium (COLACE) capsule 100 mg, 100 mg, Oral, BID, Amelia Jo, MD .  ferrous sulfate tablet 325 mg, 325 mg, Oral, BID WC, Salary, Montell D, MD .  HYDROcodone-acetaminophen (NORCO/VICODIN) 5-325 MG per tablet 1-2 tablet, 1-2 tablet, Oral, Q4H PRN, Amelia Jo, MD .  sodium chloride 0.9 % bolus 500 mL, 500 mL, Intravenous, Once, Last Rate: 250 mL/hr at 02/10/18 1336, 500 mL at 02/10/18 1336 **FOLLOWED BY** iron dextran complex (INFED) 700 mg in sodium chloride 0.9 % 500 mL IVPB, 700 mg, Intravenous, Once **FOLLOWED BY** sodium chloride 0.9 % bolus 500 mL, 500 mL, Intravenous, Once, Sainani, Vivek J, MD .  Lifitegrast 5 % SOLN 1 drop, 1 drop, Both Eyes, q morning -  Tobi Bastos, MD .  losartan (COZAAR) tablet 100 mg, 100 mg, Oral, Daily, Amelia Jo, MD, 100 mg at 02/09/18 7989 .  multivitamin with minerals tablet 1 tablet, 1 tablet, Oral, Daily, Amelia Jo, MD, 1 tablet at 02/09/18 [phone] .  ondansetron (ZOFRAN) tablet 4 mg, 4 mg, Oral, Q6H PRN **OR** ondansetron (ZOFRAN) injection 4 mg, 4 mg, Intravenous, Q6H PRN, Amelia Jo, MD .  pantoprazole (PROTONIX) injection 40 mg, 40 mg, Intravenous, Q12H, Tahiliani, Varnita B, MD, 40 mg at 02/09/18 2011 .  traZODone (DESYREL) tablet 25 mg, 25 mg, Oral, QHS PRN, Amelia Jo, MD .  vitamin C (ASCORBIC ACID) tablet 250 mg, 250 mg, Oral, BID, Salary, Montell D, MD, 250 mg at 02/09/18 2011  Facility-Administered Medications Ordered in Other Encounters:  .  0.9 %  sodium chloride infusion, , , Continuous PRN, [name], MD .  lidocaine (XYLOCAINE) 2 % injection, , , Anesthesia Intra-op, [name], MD, 100 mg at 02/10/18 1013 .  propofol (DIPRIVAN) 10 mg/mL bolus/IV push, , , Anesthesia Intra-op, [name], MD, 50 mg at 02/10/18 1013 .  propofol (DIPRIVAN) 500 MG/50ML infusion, , , Continuous PRN, [name], MD, Last Rate: 80.9 mL/hr at 02/10/18 1021, 120 mcg/kg/min at  02/10/18 1021   Physical exam:  Vitals:   02/10/18 1057 02/10/18 1107 02/10/18 1117 02/10/18 1127  BP: 129/77 (!) 144/75 (!) 154/86 (!) 155/88  Pulse: 86 80 81 77  Resp: 12 14 16 18   Temp:      TempSrc:      SpO2: 98% 99% 97% 99%  Weight:      Height:       Physical Exam     CMP Latest Ref Rng & Units 02/09/2018  Glucose 70 - 99 mg/dL 125(H)  BUN 6 - 20 mg/dL 12  Creatinine 0.44 - 1.00 mg/dL 0.71  Sodium 135 - 145 mmol/L 140  Potassium 3.5 - 5.1 mmol/L 3.6  Chloride 98 - 111 mmol/L 106  CO2 22 - 32 mmol/L 25  Calcium 8.9 - 10.3 mg/dL 8.4(L)  Total Protein 6.5 - 8.1 g/dL -  Total Bilirubin 0.3 - 1.2 mg/dL -  Alkaline Phos 38 - 126 U/L -  AST 15 - 41 U/L -  ALT 14 - 54 U/L -   CBC Latest Ref Rng & Units 02/10/2018  WBC 4.0 - 10.5 K/uL 5.6  Hemoglobin 12.0 - 15.0 g/dL 8.3(L)  Hematocrit 36.0 - 46.0 % 28.4(L)  Platelets 150 - 400 K/uL 251   RADIOGRAPHIC STUDIES: I have personally reviewed the radiological images as listed and agreed with the findings in the report.   Dg Chest 2 View  Result Date: 02/08/2018 CLINICAL DATA:  Shortness of breath and weakness for couple of weeks. EXAM: CHEST - 2 VIEW COMPARISON:  None. FINDINGS: Cardiomediastinal silhouette is normal. Mediastinal contours appear intact. There is no evidence of focal airspace consolidation, pleural effusion or pneumothorax. Osseous structures are without acute abnormality. Soft tissues are grossly normal. IMPRESSION: No active cardiopulmonary disease. Electronically Signed   By: Fidela Salisbury M.D.   On: 02/08/2018 19:31    Assessment and plan-  Patient is a 57 y.o. female  with history of IBS, hypertension, hyperglycemia, GERD, diverticulosis, mitral valve prolapse, psoriasis presented to emergency room for evaluation of profound weakness found to be severely anemic.  #Iron deficiency anemia/symptomatic anemia,  Status post 2 unit of PRBC transfusion, and IV iron with INFeD. Hemoglobin improved  appropriately Status post both upper  endoscopy and colonoscopy. No active bleeding was discovered.  Polyps were discovered and resected and retrieved.  Awaiting pathology. Recent outpatient UA on 02/08/2018- for blood. Fatigue has significantly improved. Patient can follow-up with me outpatient for monitoring her hemoglobin and additional IV iron.  We will also check celiac panel outpatient.  Thank you for allowing me to participate in the care of this patient.   Earlie Server, MD, PhD Hematology Oncology [facility] [phone]

## 2018-02-10 NOTE — Anesthesia Preprocedure Evaluation (Addendum)
Anesthesia Evaluation  Patient identified by MRN, date of birth, ID band Patient awake    Reviewed: Allergy & Precautions, NPO status , Patient's Chart, lab work & pertinent test results, reviewed documented beta blocker date and time   History of Anesthesia Complications Negative for: history of anesthetic complications  Airway Mallampati: III       Dental   Pulmonary former smoker,           Cardiovascular hypertension, Pt. on medications and Pt. on home beta blockers + Valvular Problems/Murmurs MVP      Neuro/Psych    GI/Hepatic Neg liver ROS, hiatal hernia, GERD  Medicated and Controlled,  Endo/Other  diabetes (borderline)  Renal/GU negative Renal ROS     Musculoskeletal   Abdominal   Peds  Hematology  (+) Blood dyscrasia, anemia ,   Anesthesia Other Findings   Reproductive/Obstetrics                             Anesthesia Physical  Anesthesia Plan  ASA: II  Anesthesia Plan: General   Post-op Pain Management:    Induction:   PONV Risk Score and Plan: Propofol infusion and TIVA  Airway Management Planned: Nasal Cannula and Natural Airway  Additional Equipment:   Intra-op Plan:   Post-operative Plan:   Informed Consent: I have reviewed the patients History and Physical, chart, labs and discussed the procedure including the risks, benefits and alternatives for the proposed anesthesia with the patient or authorized representative who has indicated his/her understanding and acceptance.     Plan Discussed with: Anesthesiologist  Anesthesia Plan Comments:        Anesthesia Quick Evaluation

## 2018-02-10 NOTE — Progress Notes (Signed)
[name], MD [address], [place], [place], [place], [address] [address], [place], [place], [place], [address] Phone: [phone]  Fax: [phone]   Subjective:  Patient tolerated colonoscopy prep without difficulty.  No dysphagia.  No melena, hematochezia, hematemesis.  No nausea or vomiting.  No abdominal pain.  Objective: Exam: Vital signs in last 24 hours: Vitals:   02/09/18 1659 02/09/18 1955 02/10/18 0505 02/10/18 0941  BP: (!) 152/82 (!) 159/78 (!) 142/73 (!) 167/81  Pulse: 78 82 92 81  Resp: 14 18 20 16   Temp:  98.2 F (36.8 C) 98.7 F (37.1 C) 98.4 F (36.9 C)  TempSrc:  Oral Oral Tympanic  SpO2: 97% 100% 96% 99%  Weight:   112.4 kg 112.4 kg  Height:    5\' 10"  (1.778 m)   Weight change: 3.493 kg No intake or output data in the 24 hours ending 02/10/18 1003  General: No acute distress, AAO x3 Abd: Soft, NT/ND, No HSM Skin: Warm, no rashes Neck: Supple, Trachea midline   Lab Results: Lab Results  Component Value Date   WBC 5.6 02/10/2018   HGB 8.3 (L) 02/10/2018   HCT 28.4 (L) 02/10/2018   MCV 74.9 (L) 02/10/2018   PLT 251 02/10/2018   Micro Results: No results found for this or any previous visit (from the past 240 hour(s)). Studies/Results: Dg Chest 2 View  Result Date: 02/08/2018 CLINICAL DATA:  Shortness of breath and weakness for couple of weeks. EXAM: CHEST - 2 VIEW COMPARISON:  None. FINDINGS: Cardiomediastinal silhouette is normal. Mediastinal contours appear intact. There is no evidence of focal airspace consolidation, pleural effusion or pneumothorax. Osseous structures are without acute abnormality. Soft tissues are grossly normal. IMPRESSION: No active cardiopulmonary disease. Electronically Signed   By: Fidela Salisbury M.D.   On: 02/08/2018 19:31   Medications:  Scheduled Meds: . [MAR Hold] sodium chloride   Intravenous Once  . [MAR Hold] alosetron  1 mg Oral BID  . [MAR Hold] carvedilol  25 mg Oral BID  . [MAR  Hold] cholecalciferol  1,000 Units Oral q morning - 10a  . [MAR Hold] docusate sodium  100 mg Oral BID  . [MAR Hold] ferrous sulfate  325 mg Oral BID WC  . [MAR Hold] Lifitegrast  1 drop Both Eyes q morning - 10a  . [MAR Hold] losartan  100 mg Oral Daily  . [MAR Hold] multivitamin with minerals  1 tablet Oral Daily  . [MAR Hold] pantoprazole (PROTONIX) IV  40 mg Intravenous Q12H  . [MAR Hold] vitamin C  250 mg Oral BID   Continuous Infusions: PRN Meds:.[MAR Hold] acetaminophen **OR** [MAR Hold] acetaminophen, [MAR Hold] albuterol, [MAR Hold] bisacodyl, [MAR Hold] calcium carbonate, [MAR Hold] HYDROcodone-acetaminophen, [MAR Hold] ondansetron **OR** [MAR Hold] ondansetron (ZOFRAN) IV, [MAR Hold] traZODone   Assessment: Active Problems:   Severe anemia   Symptomatic anemia   Iron deficiency anemia due to chronic blood loss   Columnar epithelial-lined lower esophagus   Hiatal hernia    Plan: Ferritin is low at 3, I would recommend IV iron transfusion on this admission We will proceed with colonoscopy today to evaluate for etiology of iron deficiency anemia  If this is negative, patient is agreeable to proceeding with small bowel capsule study today  No evidence of active GI bleeding Normal folate and B12 Please follow-up colonoscopy report after the procedure  I have discussed alternative options, risks & benefits,  which include, but are not limited to, bleeding, infection, perforation,respiratory  complication & drug reaction.  The patient agrees with this plan & written consent will be obtained.      LOS: 2 days   [name], MD 02/10/2018, 10:03 AM

## 2018-02-10 NOTE — Op Note (Signed)
[facility] Gastroenterology Patient Name: Tina Hurst Procedure Date: 02/10/2018 10:08 AM MRN: [id number] Account #: [id number] Date of Birth: 16-May-1960 Admit Type: Outpatient Age: 57 Room: [facility] ENDO ROOM 4 Gender: Female Note Status: Finalized Procedure:            Colonoscopy Indications:          Iron deficiency anemia Providers:            Trypp Heckmann B. Bonna Gains MD, MD Referring MD:         Kerin Perna MD, MD (Referring MD) Medicines:            Monitored Anesthesia Care Complications:        No immediate complications. Procedure:            Pre-Anesthesia Assessment:                       - ASA Grade Assessment: II - A patient with mild                        systemic disease.                       - Prior to the procedure, a History and Physical was                        performed, and patient medications, allergies and                        sensitivities were reviewed. The patient's tolerance of                        previous anesthesia was reviewed.                       - The risks and benefits of the procedure and the                        sedation options and risks were discussed with the                        patient. All questions were answered and informed                        consent was obtained.                       - Patient identification and proposed procedure were                        verified prior to the procedure by the physician, the                        nurse, the anesthesiologist, the anesthetist and the                        technician. The procedure was verified in the procedure                        room.  After obtaining informed consent, the colonoscope was                        passed under direct vision. Throughout the procedure,                        the patient's blood pressure, pulse, and oxygen                        saturations were monitored continuously. The                         Colonoscope was introduced through the anus and                        advanced to the 20 cm into the ileum. The colonoscopy                        was performed with ease. The patient tolerated the                        procedure well. The quality of the bowel preparation                        was good except the ascending colon was fair and the                        cecum was fair. It was cleaned well with water and                        suctioning. Findings:      The perianal and digital rectal examinations were normal.      A 10 mm polyp was found in the cecum. The polyp was flat. The polyp was       removed with a hot snare. Resection and retrieval were complete. To       prevent bleeding after the polypectomy, one hemostatic clip was       successfully placed. There was no bleeding at the end of the procedure.      Multiple diverticula were found in the sigmoid colon and ascending colon.      The exam was otherwise without abnormality.      The rectum, sigmoid colon, descending colon, transverse colon, ascending       colon, cecum and ileum appeared normal.      The retroflexed view of the distal rectum and anal verge was normal and       showed no anal or rectal abnormalities. Impression:           - One 10 mm polyp in the cecum, removed with a hot                        snare. Resected and retrieved. Clip was placed.                       - Diverticulosis in the sigmoid colon and in the                        ascending colon.                       -  The examination was otherwise normal.                       - The rectum, sigmoid colon, descending colon,                        transverse colon, ascending colon, cecum and terminal                        ileum are normal.                       - The distal rectum and anal verge are normal on                        retroflexion view. Recommendation:       - To visualize the small bowel, perform video capsule                         endoscopy today.                       - Administer IV iron while inpatient                       - Dr. Alice Reichert is on GI service for the long weekend, and                        pt was signed out to him . He will be following the                        patient over the weekend. Small bowel capsule can be                        read by Dr. Alice Reichert over the weekend.                       - Continue present medications.                       - Await pathology results.                       - Repeat colonoscopy in 2 years for surveillance. (due                        to fair prep, patient did not complete entire prep)                       - The findings and recommendations were discussed with                        the patient.                       - Return to primary care physician as previously                        scheduled. Procedure Code(s):    --- Professional ---  45385, Colonoscopy, flexible; with removal of tumor(s),                        polyp(s), or other lesion(s) by snare technique Diagnosis Code(s):    --- Professional ---                       D12.0, Benign neoplasm of cecum                       D50.9, Iron deficiency anemia, unspecified                       K57.30, Diverticulosis of large intestine without                        perforation or abscess without bleeding CPT copyright 2018 American Medical Association. All rights reserved. The codes documented in this report are preliminary and upon coder review may  be revised to meet current compliance requirements.  [name], MD Margretta Sidle B. Bonna Gains MD, MD 02/10/2018 10:55:42 AM This report has been signed electronically. Number of Addenda: 0 Note Initiated On: 02/10/2018 10:08 AM Scope Withdrawal Time: 0 hours 18 minutes 18 seconds  Total Procedure Duration: 0 hours 23 minutes 38 seconds  Estimated Blood Loss: Estimated blood loss: none.      [facility]

## 2018-02-10 NOTE — Progress Notes (Signed)
I called pharmacy and Tina Hurst a pharmacist said that the  iron infusion that I admisnstered today could be ran at 250 ml an hour if tolerated. Estill Bamberg the charge nurse was made aware. Pt did tolerate and infusion was finished. Collier Bullock RN

## 2018-02-10 NOTE — Discharge Summary (Signed)
[facility] NAME: Tina Hurst    MR#:  [id number]  DATE OF BIRTH:  01-14-1961  DATE OF ADMISSION:  02/08/2018 ADMITTING PHYSICIAN: Amelia Jo, MD  DATE OF DISCHARGE: No discharge date for patient encounter.  PRIMARY CARE PHYSICIAN: Hortencia Pilar, MD    ADMISSION DIAGNOSIS:  Symptomatic anemia [D64.9]  DISCHARGE DIAGNOSIS:  Active Problems:   Severe anemia   Symptomatic anemia   Iron deficiency anemia due to chronic blood loss   Columnar epithelial-lined lower esophagus   Hiatal hernia   Cecal polyp   Diverticulosis of colon with hemorrhage   SECONDARY DIAGNOSIS:   Past Medical History:  Diagnosis Date  . Diverticulosis   . GERD (gastroesophageal reflux disease)   . Hyperglycemia   . Hypertension   . IBS (irritable bowel syndrome)   . Mitral valve prolapse    followed by PCP  . Psoriasis   . Wears contact lenses     HOSPITAL COURSE:   57 year old female with past medical history of irritable bowel syndrome, hypertension, GERD, diverticulosis, psoriasis, history of mitral valve prolapse who presented to the hospital due to generalized weakness and lethargy and noted to have significant anemia.  1.  Acute symptomatic microcytic anemia-this was secondary to severe iron deficiency anemia.  Patient had no evidence of hematochezia or melena prior to admission. - Patient was seen by gastroenterology underwent an upper GI endoscopy which showed no acute bleeding, she underwent a colonoscopy which showed some polyps which were removed but no evidence of acute bleeding. - Patient has been transfused 2 units of packed red blood cells, received some IV iron and her hemoglobin has improved and is currently stable. - She will follow-up with hematology oncology as an outpatient.  She is being discharged on oral iron supplements. -She underwent a capsule endoscopy while in the hospital the results of which is still pending and  she will follow-up with gastroenterology for that as an outpatient.  She is not on any anticoagulants.  2.  Essential hypertension-patient will resume her losartan/HCTZ along with her Coreg.   3. Osteoporosis-patient will resume her calcium and vitamin D supplements.  DISCHARGE CONDITIONS:   Stable.   CONSULTS OBTAINED:  Treatment Team:  Lucilla Lame, MD  DRUG ALLERGIES:   Allergies  Allergen Reactions  . Augmentin [Amoxicillin-Pot Clavulanate] Nausea And Vomiting and Other (See Comments)    Pt states that she is able to take 250mg  tablet.    . Amlodipine Swelling and Palpitations  . Ginger Rash  . Sulfa Antibiotics Rash    DISCHARGE MEDICATIONS:   Allergies as of 02/10/2018      Reactions   Augmentin [amoxicillin-pot Clavulanate] Nausea And Vomiting, Other (See Comments)   Pt states that she is able to take 250mg  tablet.     Amlodipine Swelling, Palpitations   Ginger Rash   Sulfa Antibiotics Rash      Medication List    TAKE these medications   alosetron 1 MG tablet Commonly known as:  LOTRONEX Take 1 tablet (1 mg total) by mouth 2 (two) times daily.   CALCIUM 600 + D PO Take 1 tablet by mouth daily.   carvedilol 25 MG tablet Commonly known as:  COREG Take 25 mg by mouth 2 (two) times daily.   Chromium 200 MCG Tabs Take 1 mg by mouth daily.   ferrous sulfate 325 (65 FE) MG tablet Take 1 tablet (325 mg total) by mouth 2 (two) times daily with  a meal.   losartan-hydrochlorothiazide 100-12.5 MG tablet Commonly known as:  HYZAAR Take 1 tablet by mouth daily.   multivitamin with minerals Tabs tablet Take 1 tablet by mouth daily.   PRESCRIPTION MEDICATION Take 1 capsule by mouth daily. Pt takes a compounded estrogen capsule.  Biestrogen (80/20) and Progesterone 0.4mg /50mg .   VITAMIN D-1000 MAX ST 25 MCG (1000 UT) tablet Generic drug:  Cholecalciferol Take 1,000 Units by mouth daily.         DISCHARGE INSTRUCTIONS:   DIET:  Regular  diet  DISCHARGE CONDITION:  Stable  ACTIVITY:  Activity as tolerated  OXYGEN:  Home Oxygen: No.   Oxygen Delivery: room air  DISCHARGE LOCATION:  home   If you experience worsening of your admission symptoms, develop shortness of breath, life threatening emergency, suicidal or homicidal thoughts you must seek medical attention immediately by calling 911 or calling your MD immediately  if symptoms less severe.  You Must read complete instructions/literature along with all the possible adverse reactions/side effects for all the Medicines you take and that have been prescribed to you. Take any new Medicines after you have completely understood and accpet all the possible adverse reactions/side effects.   Please note  You were cared for by a hospitalist during your hospital stay. If you have any questions about your discharge medications or the care you received while you were in the hospital after you are discharged, you can call the unit and asked to speak with the hospitalist on call if the hospitalist that took care of you is not available. Once you are discharged, your primary care physician will handle any further medical issues. Please note that NO REFILLS for any discharge medications will be authorized once you are discharged, as it is imperative that you return to your primary care physician (or establish a relationship with a primary care physician if you do not have one) for your aftercare needs so that they can reassess your need for medications and monitor your lab values.     Today   Globin stable post transfusion and IV iron.  No acute complaints presently.  Will discharge home later today after capsule endoscopy has been finished.  Patient is okay with this plan.  VITAL SIGNS:  Blood pressure (!) 155/88, pulse 77, temperature (!) 97.1 F (36.2 C), temperature source Tympanic, resp. rate 18, height 5\' 10"  (1.778 m), weight 112.4 kg, SpO2 99 %.  I/O:    Intake/Output  Summary (Last 24 hours) at 02/10/2018 1409 Last data filed at 02/10/2018 1039 Gross per 24 hour  Intake 200 ml  Output -  Net 200 ml    PHYSICAL EXAMINATION:  GENERAL:  57 y.o.-year-old patient lying in the bed with no acute distress.  EYES: Pupils equal, round, reactive to light and accommodation. No scleral icterus. Extraocular muscles intact.  HEENT: Head atraumatic, normocephalic. Oropharynx and nasopharynx clear.  NECK:  Supple, no jugular venous distention. No thyroid enlargement, no tenderness.  LUNGS: Normal breath sounds bilaterally, no wheezing, rales,rhonchi. No use of accessory muscles of respiration.  CARDIOVASCULAR: S1, S2 normal. No murmurs, rubs, or gallops.  ABDOMEN: Soft, non-tender, non-distended. Bowel sounds present. No organomegaly or mass.  EXTREMITIES: No pedal edema, cyanosis, or clubbing.  NEUROLOGIC: Cranial nerves II through XII are intact. No focal motor or sensory defecits b/l.  PSYCHIATRIC: The patient is alert and oriented x 3.  SKIN: No obvious rash, lesion, or ulcer.   DATA REVIEW:   CBC Recent Labs  Lab 02/10/18 [phone]  WBC 5.6  HGB 8.3*  HCT 28.4*  PLT 251    Chemistries  Recent Labs  Lab 02/09/18 0440  NA 140  K 3.6  CL 106  CO2 25  GLUCOSE 125*  BUN 12  CREATININE 0.71  CALCIUM 8.4*    Cardiac Enzymes Recent Labs  Lab 02/08/18 1832  TROPONINI <0.03    Microbiology Results  No results found for this or any previous visit.  RADIOLOGY:  Dg Chest 2 View  Result Date: 02/08/2018 CLINICAL DATA:  Shortness of breath and weakness for couple of weeks. EXAM: CHEST - 2 VIEW COMPARISON:  None. FINDINGS: Cardiomediastinal silhouette is normal. Mediastinal contours appear intact. There is no evidence of focal airspace consolidation, pleural effusion or pneumothorax. Osseous structures are without acute abnormality. Soft tissues are grossly normal. IMPRESSION: No active cardiopulmonary disease. Electronically Signed   By: Fidela Salisbury M.D.   On: 02/08/2018 19:31      Management plans discussed with the patient, family and they are in agreement.  CODE STATUS:     Code Status Orders  (From admission, onward)         Start     Ordered   02/08/18 2308  Full code  Continuous     02/08/18 2307        Code Status History    Date Active Date Inactive Code Status Order ID Comments User Context   11/01/2014 2241 11/03/2014 1528 Full Code [id number]  Lance Coon, MD Inpatient      TOTAL TIME TAKING CARE OF THIS PATIENT: 40 minutes.    Henreitta Leber M.D on 02/10/2018 at 2:09 PM  Between 7am to 6pm - Pager - [phone]  After 6pm go to [email or web address] - Proofreader  [facility]  Office  [phone]  CC: Primary care physician; Hortencia Pilar, MD

## 2018-02-11 ENCOUNTER — Encounter: Payer: Self-pay | Admitting: Internal Medicine

## 2018-02-11 LAB — BPAM RBC
BLOOD PRODUCT EXPIRATION DATE: 201912122359
Blood Product Expiration Date: 201912122359
Blood Product Expiration Date: 201912222359
ISSUE DATE / TIME: 201911260015
ISSUE DATE / TIME: 201911260607
UNIT TYPE AND RH: 5100
Unit Type and Rh: 5100
Unit Type and Rh: 5100

## 2018-02-11 LAB — TYPE AND SCREEN
ABO/RH(D): O POS
Antibody Screen: NEGATIVE
UNIT DIVISION: 0
UNIT DIVISION: 0
Unit division: 0

## 2018-02-11 LAB — PREPARE RBC (CROSSMATCH)

## 2018-02-12 NOTE — Anesthesia Post-op Follow-up Note (Signed)
Anesthesia QCDR form completed.        

## 2018-02-12 NOTE — Transfer of Care (Signed)
Immediate Anesthesia Transfer of Care Note  Patient: Tina Hurst  Procedure(s) Performed: COLONOSCOPY (Left )  Patient Location: PACU  Anesthesia Type:General  Level of Consciousness: sedated  Airway & Oxygen Therapy: Patient Spontanous Breathing and Patient connected to nasal cannula oxygen  Post-op Assessment: Report given to RN and Post -op Vital signs reviewed and stable  Post vital signs: Reviewed and stable  Last Vitals:  Vitals Value Taken Time  BP    Temp    Pulse    Resp    SpO2      Last Pain:  Vitals:   02/10/18 1528  TempSrc: Oral  PainSc:          Complications: No apparent anesthesia complications

## 2018-02-15 ENCOUNTER — Encounter: Payer: Self-pay | Admitting: Gastroenterology

## 2018-02-15 LAB — SURGICAL PATHOLOGY

## 2018-02-15 NOTE — Anesthesia Postprocedure Evaluation (Signed)
Anesthesia Post Note  Patient: Tina Hurst  Procedure(s) Performed: COLONOSCOPY (Left )  Patient location during evaluation: PACU Anesthesia Type: General Level of consciousness: awake and alert Pain management: pain level controlled Vital Signs Assessment: post-procedure vital signs reviewed and stable Respiratory status: spontaneous breathing, nonlabored ventilation, respiratory function stable and patient connected to nasal cannula oxygen Cardiovascular status: blood pressure returned to baseline and stable Postop Assessment: no apparent nausea or vomiting Anesthetic complications: no     Last Vitals:  Vitals:   02/10/18 1127 02/10/18 1528  BP: (!) 155/88 134/77  Pulse: 77 83  Resp: 18   Temp:  36.7 C  SpO2: 99% 96%    Last Pain:  Vitals:   02/10/18 1528  TempSrc: Oral  PainSc:                  [name]

## 2018-02-16 ENCOUNTER — Ambulatory Visit (INDEPENDENT_AMBULATORY_CARE_PROVIDER_SITE_OTHER): Payer: 59 | Admitting: Gastroenterology

## 2018-02-16 ENCOUNTER — Encounter: Payer: Self-pay | Admitting: Gastroenterology

## 2018-02-16 VITALS — BP 157/79 | HR 73 | Ht 70.5 in | Wt 245.8 lb

## 2018-02-16 DIAGNOSIS — D649 Anemia, unspecified: Secondary | ICD-10-CM | POA: Diagnosis not present

## 2018-02-17 LAB — HEMOGLOBIN: Hemoglobin: 10.2 g/dL — ABNORMAL LOW (ref 11.1–15.9)

## 2018-02-17 NOTE — Progress Notes (Signed)
[name], MD [address]  [place]  [place], [place] [address]  Main: [phone]  Fax: [phone]   Primary Care Physician: Hortencia Pilar, MD  Primary Gastroenterologist:  Dr. [name]  Chief Complaint  Patient presents with  . New Patient (Initial Visit)    Anemia, IDA, Columnar epithelial lined esophagus, Hiatal hernia  . Follow-up    Hospital     HPI: Tina Hurst is a 57 y.o. female here for follow up for iron deficiency. The patient denies abdominal or flank pain, anorexia, nausea or vomiting, dysphagia, change in bowel habits or black or bloody stools or weight loss.  Small bowel capsule study read, and sent for scanning.  In brief, no signs of active bleeding or etiology of iron deficiency anemia identified on the small bowel capsule study.  No lesions seen.  Nonspecific red spots noted.  Patient was hospitalized in February 09, 2018 due to fatigue and anemia without active GI bleeding.  She underwent EGD and colonoscopy during that admission without any signs of bleeding.  EGD showed salmon-colored mucosa suspicious for Barrett's, but biopsies not done due to patient's anemia on that admission.  Exam otherwise normal.  Colonoscopy showed a 10 mm cecal polyp removed with hot snare, and clip was placed at the site.  Diverticulosis seen.  No signs of bleeding.  Colonoscope was advanced to the terminal ileum without any signs of active bleeding.  Repeat colonoscopy recommended in 2 years for surveillance due to fair prep.  Patient has been taking her oral iron replacement and reports improved energy level.  Denies any further fatigue.   Current Outpatient Medications  Medication Sig Dispense Refill  . alosetron (LOTRONEX) 1 MG tablet Take 1 tablet (1 mg total) by mouth 2 (two) times daily. 180 tablet 3  . Calcium Carb-Cholecalciferol (CALCIUM 600 + D PO) Take 1 tablet by mouth daily.     . carvedilol (COREG) 25 MG tablet Take 25 mg by  mouth 2 (two) times daily.    . Cholecalciferol (VITAMIN D-1000 MAX ST) 1000 units tablet Take 1,000 Units by mouth daily.     . Chromium 200 MCG TABS Take 1 mg by mouth daily.     . ferrous sulfate 325 (65 FE) MG tablet Take 1 tablet (325 mg total) by mouth 2 (two) times daily with a meal. 60 tablet 1  . losartan-hydrochlorothiazide (HYZAAR) 100-12.5 MG tablet Take 1 tablet by mouth daily.    . Multiple Vitamin (MULTIVITAMIN WITH MINERALS) TABS tablet Take 1 tablet by mouth daily.    Marland Kitchen PRESCRIPTION MEDICATION Take 1 capsule by mouth daily. Pt takes a compounded estrogen capsule.  Biestrogen (80/20) and Progesterone 0.4mg /50mg .     No current facility-administered medications for this visit.     Allergies as of 02/16/2018 - Review Complete 02/16/2018  Allergen Reaction Noted  . Augmentin [amoxicillin-pot clavulanate] Nausea And Vomiting and Other (See Comments) 10/29/2014  . Amlodipine Swelling and Palpitations 06/23/2012  . Ginger Rash 12/29/2014  . Sulfa antibiotics Rash 10/29/2014    ROS:  General: Negative for anorexia, weight loss, fever, chills, fatigue, weakness. ENT: Negative for hoarseness, difficulty swallowing , nasal congestion. CV: Negative for chest pain, angina, palpitations, dyspnea on exertion, peripheral edema.  Respiratory: Negative for dyspnea at rest, dyspnea on exertion, cough, sputum, wheezing.  GI: See history of present illness. GU:  Negative for dysuria, hematuria, urinary incontinence, urinary frequency, nocturnal urination.  Endo: Negative for unusual weight change.    Physical Examination:  BP (!) 157/79   Pulse 73   Ht 5' 10.5" (1.791 m)   Wt 245 lb 12.8 oz (111.5 kg)   BMI 34.77 kg/m   General: Well-nourished, well-developed in no acute distress.  Eyes: No icterus. Conjunctivae pink. Mouth: Oropharyngeal mucosa moist and pink , no lesions erythema or exudate. Neck: Supple, Trachea midline Abdomen: Bowel sounds are normal, nontender,  nondistended, no hepatosplenomegaly or masses, no abdominal bruits or hernia , no rebound or guarding.   Extremities: No lower extremity edema. No clubbing or deformities. Neuro: Alert and oriented x 3.  Grossly intact. Skin: Warm and dry, no jaundice.   Psych: Alert and cooperative, normal mood and affect.   Labs: CMP     Component Value Date/Time   NA 140 02/09/2018 0440   NA 139 08/21/2013 1029   K 3.6 02/09/2018 0440   K 3.9 08/21/2013 1029   CL 106 02/09/2018 0440   CL 104 08/21/2013 1029   CO2 25 02/09/2018 0440   CO2 27 08/21/2013 1029   GLUCOSE 125 (H) 02/09/2018 0440   GLUCOSE 141 (H) 08/21/2013 1029   BUN 12 02/09/2018 0440   BUN 15 08/21/2013 1029   CREATININE 0.71 02/09/2018 0440   CREATININE 0.66 08/21/2013 1029   CALCIUM 8.4 (L) 02/09/2018 0440   CALCIUM 9.1 08/21/2013 1029   PROT 7.8 11/01/2014 1745   PROT 7.0 08/21/2013 1029   ALBUMIN 4.5 11/01/2014 1745   ALBUMIN 3.6 08/21/2013 1029   AST 51 (H) 11/01/2014 1745   AST 27 08/21/2013 1029   ALT 42 11/01/2014 1745   ALT 43 08/21/2013 1029   ALKPHOS 106 11/01/2014 1745   ALKPHOS 108 08/21/2013 1029   BILITOT 0.5 11/01/2014 1745   BILITOT 0.8 08/21/2013 1029   GFRNONAA >60 02/09/2018 0440   GFRNONAA >60 08/21/2013 1029   GFRAA >60 02/09/2018 0440   GFRAA >60 08/21/2013 1029   Lab Results  Component Value Date   WBC 5.6 02/10/2018   HGB 10.2 (L) 02/16/2018   HCT 27.0 (L) 02/10/2018   MCV 74.9 (L) 02/10/2018   PLT 251 02/10/2018    Imaging Studies: Dg Chest 2 View  Result Date: 02/08/2018 CLINICAL DATA:  Shortness of breath and weakness for couple of weeks. EXAM: CHEST - 2 VIEW COMPARISON:  None. FINDINGS: Cardiomediastinal silhouette is normal. Mediastinal contours appear intact. There is no evidence of focal airspace consolidation, pleural effusion or pneumothorax. Osseous structures are without acute abnormality. Soft tissues are grossly normal. IMPRESSION: No active cardiopulmonary disease.  Electronically Signed   By: Fidela Salisbury M.D.   On: 02/08/2018 19:31    Assessment and Plan:   Tina Hurst is a 57 y.o. y/o female with recent admission for fatigue and anemia, and found to be iron deficient with ferritin of 3 and received IV iron transfusion in the hospital with no signs of bleeding or etiology of iron deficiency anemia on EGD, colonoscopy, and small bowel capsule  Hemoglobin improved to 10 today, which is 2 g improvement from her hospital discharge Patient compliant with iron replacement Follow-up with hematology for iron deficiency  Patient asked to contact us if she develops any signs of active GI bleeding and she verbalized understanding No alarm symptoms present at this time  We will continue to monitor hemoglobin along with hematology Patient will need repeat EGD in the near future, 3 to 6 months for biopsies of possible Barrett's Denies any heartburn or dysphagia Continue antireflux lifestyle modifications and this was discussed in detail with  her including using a bed wedge at night    Dr [name]

## 2018-02-19 ENCOUNTER — Other Ambulatory Visit: Payer: Self-pay

## 2018-02-19 DIAGNOSIS — D509 Iron deficiency anemia, unspecified: Secondary | ICD-10-CM

## 2018-02-23 ENCOUNTER — Encounter: Payer: Self-pay | Admitting: Gastroenterology

## 2018-02-23 ENCOUNTER — Inpatient Hospital Stay: Payer: 59

## 2018-02-23 ENCOUNTER — Encounter: Payer: Self-pay | Admitting: Oncology

## 2018-02-23 ENCOUNTER — Inpatient Hospital Stay (HOSPITAL_BASED_OUTPATIENT_CLINIC_OR_DEPARTMENT_OTHER): Payer: 59 | Admitting: Oncology

## 2018-02-23 ENCOUNTER — Other Ambulatory Visit: Payer: Self-pay

## 2018-02-23 ENCOUNTER — Inpatient Hospital Stay: Payer: 59 | Attending: Oncology

## 2018-02-23 VITALS — BP 146/95 | HR 83 | Temp 97.2°F | Resp 18 | Ht 70.5 in | Wt 243.3 lb

## 2018-02-23 DIAGNOSIS — D500 Iron deficiency anemia secondary to blood loss (chronic): Secondary | ICD-10-CM | POA: Insufficient documentation

## 2018-02-23 DIAGNOSIS — I10 Essential (primary) hypertension: Secondary | ICD-10-CM | POA: Insufficient documentation

## 2018-02-23 DIAGNOSIS — D509 Iron deficiency anemia, unspecified: Secondary | ICD-10-CM

## 2018-02-23 DIAGNOSIS — Z79899 Other long term (current) drug therapy: Secondary | ICD-10-CM | POA: Insufficient documentation

## 2018-02-23 DIAGNOSIS — Z87891 Personal history of nicotine dependence: Secondary | ICD-10-CM | POA: Diagnosis not present

## 2018-02-23 DIAGNOSIS — R12 Heartburn: Secondary | ICD-10-CM

## 2018-02-23 DIAGNOSIS — K2270 Barrett's esophagus without dysplasia: Secondary | ICD-10-CM

## 2018-02-23 LAB — CBC WITH DIFFERENTIAL/PLATELET
Abs Immature Granulocytes: 0.01 10*3/uL (ref 0.00–0.07)
BASOS PCT: 1 %
Basophils Absolute: 0 10*3/uL (ref 0.0–0.1)
Eosinophils Absolute: 0.1 10*3/uL (ref 0.0–0.5)
Eosinophils Relative: 2 %
HCT: 37.9 % (ref 36.0–46.0)
Hemoglobin: 11.3 g/dL — ABNORMAL LOW (ref 12.0–15.0)
IMMATURE GRANULOCYTES: 0 %
LYMPHS PCT: 24 %
Lymphs Abs: 1.2 10*3/uL (ref 0.7–4.0)
MCH: 25 pg — ABNORMAL LOW (ref 26.0–34.0)
MCHC: 29.8 g/dL — AB (ref 30.0–36.0)
MCV: 83.8 fL (ref 80.0–100.0)
MONOS PCT: 10 %
Monocytes Absolute: 0.5 10*3/uL (ref 0.1–1.0)
NEUTROS ABS: 3.1 10*3/uL (ref 1.7–7.7)
NEUTROS PCT: 63 %
PLATELETS: 241 10*3/uL (ref 150–400)
RBC: 4.52 MIL/uL (ref 3.87–5.11)
RDW: 29.1 % — AB (ref 11.5–15.5)
WBC: 5 10*3/uL (ref 4.0–10.5)
nRBC: 0 % (ref 0.0–0.2)

## 2018-02-23 LAB — RETIC PANEL
Immature Retic Fract: 13.8 % (ref 2.3–15.9)
RBC.: 4.52 MIL/uL (ref 3.87–5.11)
RETIC CT PCT: 0.8 % (ref 0.4–3.1)
RETICULOCYTE HEMOGLOBIN: 35.7 pg (ref >27.9)
Retic Count, Absolute: 37.1 10*3/uL (ref 19.0–186.0)

## 2018-02-23 MED ORDER — FERROUS SULFATE 325 (65 FE) MG PO TABS: 325.0000 mg | ORAL_TABLET | Freq: Two times a day (BID) | ORAL | 2 refills | Status: DC

## 2018-02-23 MED ORDER — OMEPRAZOLE 20 MG PO CPDR: 20.0000 mg | DELAYED_RELEASE_CAPSULE | Freq: Every day | ORAL | 2 refills | Status: DC

## 2018-02-23 NOTE — Progress Notes (Signed)
Hematology/Oncology Follow Up Note [facility]  [phone] [phone]  Patient Care Team: Hortencia Pilar, MD as PCP - General (Family Medicine)   Name of the patient: Tina Hurst  [id number]  [birth year]   [facility] follow up for symptomatic anemia.   INTERVAL HISTORY 57 y.o. female with PMH listed as below presents for follow up after her recent hospitalization.  She was recently admitted for symptomatic anemia secondary iron deficiency. S/p PRBC transfusion x 2 units and IV InFed Was seen by GI during her admission. S/p EGD/Colonoscopy. No obvious signs of GI bleeding.  EGD noted salmon colored mucosa, ? Barrett's esophagitis. Biopsy was not done due to anemia.   Reports doing well today. Fatigue has improved significantly.  On antibiotics Keflex 500mg  TID for UTI. Reports having heart burn symptoms.  Takes oral iron supplementation.   Review of Systems  Constitutional: Negative for appetite change, chills, fatigue and fever.  HENT:   Negative for hearing loss and voice change.   Eyes: Negative for eye problems.  Respiratory: Negative for chest tightness and cough.   Cardiovascular: Negative for chest pain.  Gastrointestinal: Negative for abdominal distention, abdominal pain and blood in stool.       Heart burn while taking antibiotics.   Endocrine: Negative for hot flashes.  Genitourinary: Negative for difficulty urinating and frequency.   Musculoskeletal: Negative for arthralgias.  Skin: Negative for itching and rash.  Neurological: Negative for extremity weakness.  Hematological: Negative for adenopathy.  Psychiatric/Behavioral: Negative for confusion.      Allergies  Allergen Reactions  . Augmentin [Amoxicillin-Pot Clavulanate] Nausea And Vomiting and Other (See Comments)    Pt states that she is able to take 250mg  tablet.    . Amlodipine Swelling and Palpitations  . Ginger Rash  . Sulfa Antibiotics  Rash     Past Medical History:  Diagnosis Date  . Diverticulosis   . GERD (gastroesophageal reflux disease)   . Hyperglycemia   . Hypertension   . IBS (irritable bowel syndrome)   . Mitral valve prolapse    followed by PCP  . Psoriasis   . Wears contact lenses      Past Surgical History:  Procedure Laterality Date  . CHOLECYSTECTOMY    . COLONOSCOPY Left 02/10/2018   Procedure: COLONOSCOPY;  Surgeon: Virgel Manifold, MD;  Location: [facility] ENDOSCOPY;  Service: Endoscopy;  Laterality: Left;  . COLONOSCOPY WITH PROPOFOL N/A 01/05/2015   Procedure: COLONOSCOPY WITH PROPOFOL;  Surgeon: Lucilla Lame, MD;  Location: [facility];  Service: Endoscopy;  Laterality: N/A;  . ESOPHAGOGASTRODUODENOSCOPY N/A 02/09/2018   Procedure: ESOPHAGOGASTRODUODENOSCOPY (EGD);  Surgeon: Virgel Manifold, MD;  Location[facility] ENDOSCOPY;  Service: Endoscopy;  Laterality: N/A;  . LAPAROSCOPIC TUBAL LIGATION    . POLYPECTOMY  01/05/2015   Procedure: POLYPECTOMY;  Surgeon: Lucilla Lame, MD;  Location: [facility];  Service: Endoscopy;;    Social History   Socioeconomic History  . Marital status: Married    Spouse name: Not on file  . Number of children: Not on file  . Years of education: Not on file  . Highest education level: Not on file  Occupational History  . Not on file  Social Needs  . Financial resource strain: Not on file  . Food insecurity:    Worry: Not on file    Inability: Not on file  . Transportation needs:    Medical: Not on file    Non-medical: Not on file  Tobacco Use  . Smoking status: Former Research scientist (life sciences)  . Smokeless tobacco: Never Used  . Tobacco comment: quit about 10 yrs ago  Substance and Sexual Activity  . Alcohol use: Yes    Alcohol/week: 3.0 standard drinks    Types: 1 Glasses of wine, 1 Cans of beer, 1 Shots of liquor per week    Comment: occassional - 4 drinks a week  . Drug use: No  . Sexual activity: Not on file  Lifestyle  . Physical  activity:    Days per week: Not on file    Minutes per session: Not on file  . Stress: Not on file  Relationships  . Social connections:    Talks on phone: Not on file    Gets together: Not on file    Attends religious service: Not on file    Active member of club or organization: Not on file    Attends meetings of clubs or organizations: Not on file    Relationship status: Not on file  . Intimate partner violence:    Fear of current or ex partner: Not on file    Emotionally abused: Not on file    Physically abused: Not on file    Forced sexual activity: Not on file  Other Topics Concern  . Not on file  Social History Narrative  . Not on file    Family History  Problem Relation Age of Onset  . Irritable bowel syndrome Sister   . Hypertension Mother   . Diabetes Mother   . Hypertension Father   . Alcohol abuse Father   . COPD Brother      Current Outpatient Medications:  .  alosetron (LOTRONEX) 1 MG tablet, Take 1 tablet (1 mg total) by mouth 2 (two) times daily., Disp: 180 tablet, Rfl: 3 .  Calcium Carb-Cholecalciferol (CALCIUM 600 + D PO), Take 1 tablet by mouth daily. , Disp: , Rfl:  .  carvedilol (COREG) 25 MG tablet, Take 25 mg by mouth 2 (two) times daily., Disp: , Rfl:  .  cephALEXin (KEFLEX) 500 MG capsule, TAKE 1 CAPSULE BY MOUTH THREE TIMES A DAY, Disp: , Rfl: 0 .  Cholecalciferol (VITAMIN D-1000 MAX ST) 1000 units tablet, Take 1,000 Units by mouth daily. , Disp: , Rfl:  .  Chromium 200 MCG TABS, Take 1 mg by mouth daily. , Disp: , Rfl:  .  ferrous sulfate 325 (65 FE) MG tablet, Take 1 tablet (325 mg total) by mouth 2 (two) times daily with a meal., Disp: 60 tablet, Rfl: 2 .  losartan-hydrochlorothiazide (HYZAAR) 100-12.5 MG tablet, Take 1 tablet by mouth daily., Disp: , Rfl:  .  metFORMIN (GLUCOPHAGE) 500 MG tablet, Take 500 mg by mouth 2 (two) times daily with a meal., Disp: , Rfl: 11 .  Multiple Vitamin (MULTIVITAMIN WITH MINERALS) TABS tablet, Take 1 tablet by  mouth daily., Disp: , Rfl:  .  omeprazole (PRILOSEC) 20 MG capsule, Take 1 capsule (20 mg total) by mouth daily., Disp: 30 capsule, Rfl: 2 .  PRESCRIPTION MEDICATION, Take 1 capsule by mouth daily. Pt takes a compounded estrogen capsule.  Biestrogen (80/20) and Progesterone 0.4mg /50mg ., Disp: , Rfl:   Physical exam:  Vitals:   02/23/18 1344  BP: (!) 146/95  Pulse: 83  Resp: 18  Temp: (!) 97.2 F (36.2 C)  TempSrc: Tympanic  Weight: 243 lb 4.8 oz (110.4 kg)  Height: 5' 10.5" (1.791 m)   Physical Exam  Constitutional: She is oriented to person, place,  and time. No distress.  HENT:  Head: Normocephalic and atraumatic.  Mouth/Throat: Oropharynx is clear and moist.  Eyes: Pupils are equal, round, and reactive to light. EOM are normal. No scleral icterus.  Neck: Normal range of motion. Neck supple.  Cardiovascular: Normal rate, regular rhythm and normal heart sounds.  Pulmonary/Chest: Effort normal. No respiratory distress. She has no wheezes.  Abdominal: Soft. Bowel sounds are normal. She exhibits no distension and no mass. There is no tenderness.  Musculoskeletal: Normal range of motion. She exhibits no edema or deformity.  Neurological: She is alert and oriented to person, place, and time. No cranial nerve deficit. Coordination normal.  Skin: Skin is warm and dry. No rash noted. No erythema.  Psychiatric: She has a normal mood and affect. Her behavior is normal. Thought content normal.    CMP Latest Ref Rng & Units 02/09/2018  Glucose 70 - 99 mg/dL 125(H)  BUN 6 - 20 mg/dL 12  Creatinine 0.44 - 1.00 mg/dL 0.71  Sodium 135 - 145 mmol/L 140  Potassium 3.5 - 5.1 mmol/L 3.6  Chloride 98 - 111 mmol/L 106  CO2 22 - 32 mmol/L 25  Calcium 8.9 - 10.3 mg/dL 8.4(L)  Total Protein 6.5 - 8.1 g/dL -  Total Bilirubin 0.3 - 1.2 mg/dL -  Alkaline Phos 38 - 126 U/L -  AST 15 - 41 U/L -  ALT 14 - 54 U/L -   CBC Latest Ref Rng & Units 02/23/2018  WBC 4.0 - 10.5 K/uL 5.0  Hemoglobin 12.0 -  15.0 g/dL 11.3(L)  Hematocrit 36.0 - 46.0 % 37.9  Platelets 150 - 400 K/uL 241    Dg Chest 2 View  Result Date: 02/08/2018 CLINICAL DATA:  Shortness of breath and weakness for couple of weeks. EXAM: CHEST - 2 VIEW COMPARISON:  None. FINDINGS: Cardiomediastinal silhouette is normal. Mediastinal contours appear intact. There is no evidence of focal airspace consolidation, pleural effusion or pneumothorax. Osseous structures are without acute abnormality. Soft tissues are grossly normal. IMPRESSION: No active cardiopulmonary disease. Electronically Signed   By: Fidela Salisbury M.D.   On: 02/08/2018 19:31     Assessment and plan  1. Iron deficiency anemia due to chronic blood loss   2. Heart burn    Labs are reviewed and discussed with patient. Hemoglobin has increased to 11.3. retic panel showed adequate retic hemoglobin level.  Hold additional IV iron.  Continue take oral ferrous sulfate 325mg  BID.  Heart burn, advise patient to try omeprazole 20mg  daily. Per GI she needs to repeat EGD in 3-6 months.    Orders Placed This Encounter  Procedures  . CBC with Differential/Platelet    Standing Status:   Future    Standing Expiration Date:   02/23/2019  . Ferritin    Standing Status:   Future    Standing Expiration Date:   02/24/2019  . Iron and TIBC    Standing Status:   Future    Standing Expiration Date:   02/24/2019     Follow up in 3 months.  Total face to face encounter time for this patient visit was 25 min. >50% of the time was  spent in counseling and coordination of care.   Earlie Server, MD, PhD Hematology Oncology [facility] at [facility] Pager- [phone] 02/23/2018

## 2018-02-23 NOTE — Progress Notes (Signed)
No venofer for patient today per MD

## 2018-02-23 NOTE — Progress Notes (Signed)
Pt here for initial visit. Currently on antibiotic for UTI.

## 2018-02-24 ENCOUNTER — Encounter: Payer: Self-pay | Admitting: Gastroenterology

## 2018-05-07 ENCOUNTER — Encounter: Payer: Self-pay | Admitting: Oncology

## 2018-05-24 ENCOUNTER — Inpatient Hospital Stay: Payer: 59 | Admitting: Oncology

## 2018-05-24 ENCOUNTER — Inpatient Hospital Stay: Payer: 59

## 2018-05-31 ENCOUNTER — Ambulatory Visit: Payer: 59 | Admitting: Gastroenterology

## 2018-06-02 ENCOUNTER — Encounter: Payer: Self-pay | Admitting: Family Medicine

## 2018-06-02 ENCOUNTER — Other Ambulatory Visit: Payer: Self-pay

## 2018-06-02 ENCOUNTER — Ambulatory Visit: Payer: 59 | Admitting: Gastroenterology

## 2018-06-02 ENCOUNTER — Encounter: Payer: Self-pay | Admitting: Gastroenterology

## 2018-06-02 VITALS — BP 144/85 | HR 67 | Ht 70.0 in | Wt 249.4 lb

## 2018-06-02 DIAGNOSIS — R197 Diarrhea, unspecified: Secondary | ICD-10-CM

## 2018-06-02 DIAGNOSIS — Z9049 Acquired absence of other specified parts of digestive tract: Secondary | ICD-10-CM

## 2018-06-02 DIAGNOSIS — D509 Iron deficiency anemia, unspecified: Secondary | ICD-10-CM | POA: Diagnosis not present

## 2018-06-02 DIAGNOSIS — K9189 Other postprocedural complications and disorders of digestive system: Secondary | ICD-10-CM

## 2018-06-02 MED ORDER — CHOLESTYRAMINE 4 G PO PACK: 4.0000 g | PACK | Freq: Every day | ORAL | 1 refills | Status: DC

## 2018-06-02 NOTE — Progress Notes (Signed)
Tina Antigua, MD [address]  [place]  [place], [place] [address]  Main: [phone]  Fax: [phone]   Primary Care Physician: Hortencia Pilar, MD   Chief Complaint  Patient presents with  . Follow-up    IDA, Discuss IBS and Med: Amitriptyline    HPI: Tina Hurst is a 58 y.o. female here for follow-up of iron deficiency anemia.  Patient has been following with hematology, and most recent hemoglobin is normal at 14, with improvement in iron levels as well.  Patient states she was told to discontinue iron due to normal levels at this time. The patient denies abdominal or flank pain, anorexia, nausea or vomiting, dysphagia, change in bowel habits or black or bloody stools or weight loss.  She does report chronic loose stools, that occur daily in the morning.  She has been on aldosterone which is significant medications that she is unable to take it daily as it costs her thousands of dollars, but it does help when she takes it.  She takes Imodium as needed which helps as well.    Patient was hospitalized in February 09, 2018 due to fatigue and anemia without active GI bleeding.  She underwent EGD and colonoscopy during that admission without any signs of bleeding.  EGD showed salmon-colored mucosa suspicious for Barrett's, but biopsies not done due to patient's anemia on that admission.  Exam otherwise normal.  Colonoscopy showed a 10 mm cecal polyp removed with hot snare, and clip was placed at the site.  Diverticulosis seen.  No signs of bleeding.  Colonoscope was advanced to the terminal ileum without any signs of active bleeding.  Repeat colonoscopy recommended in 2 years for surveillance due to fair prep.  Small bowel capsule study read, and sent for scanning.  In brief, no signs of active bleeding or etiology of iron deficiency anemia identified on the small bowel capsule study.  No lesions seen.  Nonspecific red spots noted.  Current Outpatient Medications   Medication Sig Dispense Refill  . alosetron (LOTRONEX) 1 MG tablet Take 1 tablet (1 mg total) by mouth 2 (two) times daily. 180 tablet 3  . Calcium Carb-Cholecalciferol (CALCIUM 600 + D PO) Take 1 tablet by mouth daily.     . carvedilol (COREG) 25 MG tablet Take 25 mg by mouth 2 (two) times daily.    . Cholecalciferol (VITAMIN D-1000 MAX ST) 1000 units tablet Take 1,000 Units by mouth daily.     . ferrous sulfate 325 (65 FE) MG tablet Take 1 tablet (325 mg total) by mouth 2 (two) times daily with a meal. 60 tablet 2  . losartan-hydrochlorothiazide (HYZAAR) 100-12.5 MG tablet Take 1 tablet by mouth daily.    . metFORMIN (GLUCOPHAGE) 500 MG tablet Take 500 mg by mouth 2 (two) times daily with a meal.  11  . Multiple Vitamin (MULTIVITAMIN WITH MINERALS) TABS tablet Take 1 tablet by mouth daily.    Marland Kitchen omeprazole (PRILOSEC) 20 MG capsule Take 1 capsule (20 mg total) by mouth daily. 30 capsule 2  . PRESCRIPTION MEDICATION Take 1 capsule by mouth daily. Pt takes a compounded estrogen capsule.  Biestrogen (80/20) and Progesterone 0.4mg /50mg .    . cephALEXin (KEFLEX) 500 MG capsule TAKE 1 CAPSULE BY MOUTH THREE TIMES A DAY  0  . cholestyramine (QUESTRAN) 4 g packet Take 1 packet (4 g total) by mouth daily for 30 days. 30 packet 1  . Chromium 200 MCG TABS Take 1 mg by mouth daily.  No current facility-administered medications for this visit.     Allergies as of 06/02/2018 - Review Complete 06/02/2018  Allergen Reaction Noted  . Augmentin [amoxicillin-pot clavulanate] Nausea And Vomiting and Other (See Comments) 10/29/2014  . Amlodipine Swelling and Palpitations 06/23/2012  . Ginger Rash 12/29/2014  . Sulfa antibiotics Rash 10/29/2014    ROS:  General: Negative for anorexia, weight loss, fever, chills, fatigue, weakness. ENT: Negative for hoarseness, difficulty swallowing , nasal congestion. CV: Negative for chest pain, angina, palpitations, dyspnea on exertion, peripheral edema.   Respiratory: Negative for dyspnea at rest, dyspnea on exertion, cough, sputum, wheezing.  GI: See history of present illness. GU:  Negative for dysuria, hematuria, urinary incontinence, urinary frequency, nocturnal urination.  Endo: Negative for unusual weight change.    Physical Examination:   BP (!) 144/85   Pulse 67   Ht 5\' 10"  (1.778 m)   Wt 249 lb 6.4 oz (113.1 kg)   BMI 35.79 kg/m   General: Well-nourished, well-developed in no acute distress.  Eyes: No icterus. Conjunctivae pink. Mouth: Oropharyngeal mucosa moist and pink , no lesions erythema or exudate. Neck: Supple, Trachea midline Abdomen: Bowel sounds are normal, nontender, nondistended, no hepatosplenomegaly or masses, no abdominal bruits or hernia , no rebound or guarding.   Extremities: No lower extremity edema. No clubbing or deformities. Neuro: Alert and oriented x 3.  Grossly intact. Skin: Warm and dry, no jaundice.   Psych: Alert and cooperative, normal mood and affect.   Labs: CMP     Component Value Date/Time   NA 140 02/09/2018 0440   NA 139 08/21/2013 1029   K 3.6 02/09/2018 0440   K 3.9 08/21/2013 1029   CL 106 02/09/2018 0440   CL 104 08/21/2013 1029   CO2 25 02/09/2018 0440   CO2 27 08/21/2013 1029   GLUCOSE 125 (H) 02/09/2018 0440   GLUCOSE 141 (H) 08/21/2013 1029   BUN 12 02/09/2018 0440   BUN 15 08/21/2013 1029   CREATININE 0.71 02/09/2018 0440   CREATININE 0.66 08/21/2013 1029   CALCIUM 8.4 (L) 02/09/2018 0440   CALCIUM 9.1 08/21/2013 1029   PROT 7.8 11/01/2014 1745   PROT 7.0 08/21/2013 1029   ALBUMIN 4.5 11/01/2014 1745   ALBUMIN 3.6 08/21/2013 1029   AST 51 (H) 11/01/2014 1745   AST 27 08/21/2013 1029   ALT 42 11/01/2014 1745   ALT 43 08/21/2013 1029   ALKPHOS 106 11/01/2014 1745   ALKPHOS 108 08/21/2013 1029   BILITOT 0.5 11/01/2014 1745   BILITOT 0.8 08/21/2013 1029   GFRNONAA >60 02/09/2018 0440   GFRNONAA >60 08/21/2013 1029   GFRAA >60 02/09/2018 0440   GFRAA >60  08/21/2013 1029   Lab Results  Component Value Date   WBC 5.0 02/23/2018   HGB 11.3 (L) 02/23/2018   HCT 37.9 02/23/2018   MCV 83.8 02/23/2018   PLT 241 02/23/2018    Imaging Studies: No results found.  Assessment and Plan:   Tina Hurst is a 58 y.o. y/o female here for follow-up of iron deficiency anemia  Hemoglobin now normal Iron levels improved with iron replacement Continue to follow-up with hematology  No episodes of bleeding  Patient reports chronic diarrhea and has previous history of cholecystectomy We will start on cholestyramine and I have asked patient to call us in 2 to 3 weeks if this does not help, as we will increase the medication to twice daily from once daily at that point. Stop alesteron as she is only taking  it as needed  Patient needs a repeat EGD to obtain biopsies for Barrett's esophagus, but we are not scheduling elective procedures at this time due to coronavirus situation and this will need to be rescheduled at a later date.  Patient verbalized understanding.  No urgency at this time.  Patient's reflux symptoms are well controlled with omeprazole     Dr Tina Hurst

## 2018-06-03 NOTE — Addendum Note (Signed)
Addended by: Earl Lagos on: 06/03/2018 02:11 PM   Modules accepted: Orders

## 2018-07-17 ENCOUNTER — Other Ambulatory Visit: Payer: Self-pay

## 2018-07-19 MED ORDER — CHOLESTYRAMINE 4 G PO PACK: 4.0000 g | PACK | Freq: Every day | ORAL | 1 refills | Status: DC

## 2018-07-19 NOTE — Telephone Encounter (Signed)
Please advise if I can refill this.

## 2018-12-01 ENCOUNTER — Ambulatory Visit (INDEPENDENT_AMBULATORY_CARE_PROVIDER_SITE_OTHER): Payer: Managed Care, Other (non HMO) | Admitting: Gastroenterology

## 2018-12-01 ENCOUNTER — Encounter: Payer: Self-pay | Admitting: Gastroenterology

## 2018-12-01 ENCOUNTER — Telehealth: Payer: Self-pay

## 2018-12-01 DIAGNOSIS — D509 Iron deficiency anemia, unspecified: Secondary | ICD-10-CM

## 2018-12-01 MED ORDER — CHOLESTYRAMINE 4 G PO PACK: 4.0000 g | PACK | Freq: Every day | ORAL | 1 refills | Status: DC

## 2018-12-01 NOTE — Patient Instructions (Signed)

## 2018-12-01 NOTE — Progress Notes (Signed)
[name], MD [address]  [place]  [place], [place] [address]  Main: [phone]  Fax: [phone]   Primary Care Physician: Hortencia Pilar, MD     [name], MD [address]  [place]  [place], [place] [address]  Main: [phone]  Fax: [phone]   Primary Care Physician: Hortencia Pilar, MD  Virtual Visit via Video Note  I connected with patient on 12/01/18 at  1:45 PM EDT by video (using [email or web address]) and verified that I am speaking with the correct person using two identifiers.   I discussed the limitations, risks, security and privacy concerns of performing an evaluation and management service by video and the availability of in person appointments. I also discussed with the patient that there may be a patient responsible charge related to this service. The patient expressed understanding and agreed to proceed.  Location of Patient: Home Location of Provider: Home Persons involved: Patient and provider only (Nursing staff checked in patient via phone but were not physically involved in the video interaction - see their notes)  Speech recognition software was used to dictate this note.    Chief Complaint  Patient presents with  . Anemia    Patient states she is having no symptoms     HPI: Tina Hurst is a 58 y.o. female with history of iron deficiency, with improved iron levels with replacement by hematology.  Here for follow-up.  The patient denies abdominal or flank pain, anorexia, nausea or vomiting, dysphagia, change in bowel habits or black or bloody stools or weight loss.  Was started on Questran on last visit due to loose stools since her cholecystectomy years ago and has been doing very well since then.  Formed bowel movements daily was 4 g once daily dosing and no difficulty.    Patient was hospitalized in February 09, 2018 due to fatigue and anemia without active GI bleeding.  She underwent EGD and colonoscopy  during that admission without any signs of bleeding.  EGD showed salmon-colored mucosa suspicious for Barrett's, but biopsies not done due to patient's anemia on that admission.  Exam otherwise normal.  Colonoscopy showed a 10 mm cecal polyp removed with hot snare, and clip was placed at the site.  Diverticulosis seen.  No signs of bleeding.  Colonoscope was advanced to the terminal ileum without any signs of active bleeding.  Repeat colonoscopy recommended in 2 years for surveillance due to fair prep.  Small bowel capsule study read, and sent for scanning.  In brief, no signs of active bleeding or etiology of iron deficiency anemia identified on the small bowel capsule study.  No lesions seen.  Nonspecific red spots noted.  Current Outpatient Medications  Medication Sig Dispense Refill  . Calcium Carb-Cholecalciferol (CALCIUM 600 + D PO) Take 1 tablet by mouth daily.     . carvedilol (COREG) 25 MG tablet Take 25 mg by mouth 2 (two) times daily.    . Cholecalciferol (VITAMIN D-1000 MAX ST) 1000 units tablet Take 1,000 Units by mouth daily.     . cholestyramine (QUESTRAN) 4 g packet Take 1 packet (4 g total) by mouth daily for 30 days. 30 packet 1  . Chromium 200 MCG TABS Take 1 mg by mouth daily.     Marland Kitchen losartan-hydrochlorothiazide (HYZAAR) 100-12.5 MG tablet Take 1 tablet by mouth daily.    . metFORMIN (GLUCOPHAGE) 500 MG tablet Take 500 mg by mouth 2 (two) times daily with a meal.  11  .  Multiple Vitamin (MULTIVITAMIN WITH MINERALS) TABS tablet Take 1 tablet by mouth daily.    Marland Kitchen omeprazole (PRILOSEC) 20 MG capsule Take 1 capsule (20 mg total) by mouth daily. 30 capsule 2  . cephALEXin (KEFLEX) 500 MG capsule TAKE 1 CAPSULE BY MOUTH THREE TIMES A DAY  0  . ferrous sulfate 325 (65 FE) MG tablet Take 1 tablet (325 mg total) by mouth 2 (two) times daily with a meal. (Patient not taking: Reported on 12/01/2018) 60 tablet 2  . PRESCRIPTION MEDICATION Take 1 capsule by mouth daily. Pt takes a  compounded estrogen capsule.  Biestrogen (80/20) and Progesterone 0.4mg /50mg .     No current facility-administered medications for this visit.     Allergies as of 12/01/2018 - Review Complete 12/01/2018  Allergen Reaction Noted  . Augmentin [amoxicillin-pot clavulanate] Nausea And Vomiting and Other (See Comments) 10/29/2014  . Amlodipine Swelling and Palpitations 06/23/2012  . Ginger Rash 12/29/2014  . Sulfa antibiotics Rash 10/29/2014    ROS:  General: Negative for anorexia, weight loss, fever, chills, fatigue, weakness. ENT: Negative for hoarseness, difficulty swallowing , nasal congestion. CV: Negative for chest pain, angina, palpitations, dyspnea on exertion, peripheral edema.  Respiratory: Negative for dyspnea at rest, dyspnea on exertion, cough, sputum, wheezing.  GI: See history of present illness. GU:  Negative for dysuria, hematuria, urinary incontinence, urinary frequency, nocturnal urination.  Endo: Negative for unusual weight change.      Labs: CMP     Component Value Date/Time   NA 140 02/09/2018 0440   NA 139 08/21/2013 1029   K 3.6 02/09/2018 0440   K 3.9 08/21/2013 1029   CL 106 02/09/2018 0440   CL 104 08/21/2013 1029   CO2 25 02/09/2018 0440   CO2 27 08/21/2013 1029   GLUCOSE 125 (H) 02/09/2018 0440   GLUCOSE 141 (H) 08/21/2013 1029   BUN 12 02/09/2018 0440   BUN 15 08/21/2013 1029   CREATININE 0.71 02/09/2018 0440   CREATININE 0.66 08/21/2013 1029   CALCIUM 8.4 (L) 02/09/2018 0440   CALCIUM 9.1 08/21/2013 1029   PROT 7.8 11/01/2014 1745   PROT 7.0 08/21/2013 1029   ALBUMIN 4.5 11/01/2014 1745   ALBUMIN 3.6 08/21/2013 1029   AST 51 (H) 11/01/2014 1745   AST 27 08/21/2013 1029   ALT 42 11/01/2014 1745   ALT 43 08/21/2013 1029   ALKPHOS 106 11/01/2014 1745   ALKPHOS 108 08/21/2013 1029   BILITOT 0.5 11/01/2014 1745   BILITOT 0.8 08/21/2013 1029   GFRNONAA >60 02/09/2018 0440   GFRNONAA >60 08/21/2013 1029   GFRAA >60 02/09/2018 0440   GFRAA  >60 08/21/2013 1029   Lab Results  Component Value Date   WBC 5.0 02/23/2018   HGB 11.3 (L) 02/23/2018   HCT 37.9 02/23/2018   MCV 83.8 02/23/2018   PLT 241 02/23/2018    Imaging Studies: No results found.  Assessment and Plan:   Tina Hurst is a 58 y.o. y/o female with iron deficiency anemia here for follow-up  Hemoglobin improved Follow-up with hematology  Continue Questran as it has resolved her diarrhea  Need for repeat upper endoscopy discussed to obtain biopsies for Barrett's esophagus.  Patient denies any heartburn and is on omeprazole once daily by her PCP and this has controlled her heartburn completely.  She is not ready to schedule any upper endoscopy at this time as she is afraid to come to a hospital due to current [place] pandemic.  We reassured her about our precautions being instituted  during the pandemic.  However, she would like to wait and follow-up with [place] in 6 months and will call us if symptoms recur in the meantime  (Risks of PPI use were discussed with patient including bone loss, C. Diff diarrhea, pneumonia, infections, CKD, electrolyte abnormalities. Pt. Verbalizes understanding and chooses to continue the medication.)   I provided 15 minutes of video evaluation  Dr [name]

## 2018-12-01 NOTE — Telephone Encounter (Signed)
Please make patient a visit with Dr. Bonna Gains in 6 months

## 2019-09-05 DIAGNOSIS — R600 Localized edema: Secondary | ICD-10-CM | POA: Insufficient documentation

## 2019-09-05 DIAGNOSIS — R0602 Shortness of breath: Secondary | ICD-10-CM | POA: Insufficient documentation

## 2019-10-09 IMAGING — CR DG CHEST 2V
2 series · 2 of 2 positions shown · non-contrast
Comparison: None.

CLINICAL DATA: Shortness of breath and weakness for couple of
weeks.

EXAM:
CHEST - 2 VIEW

[chest pa]
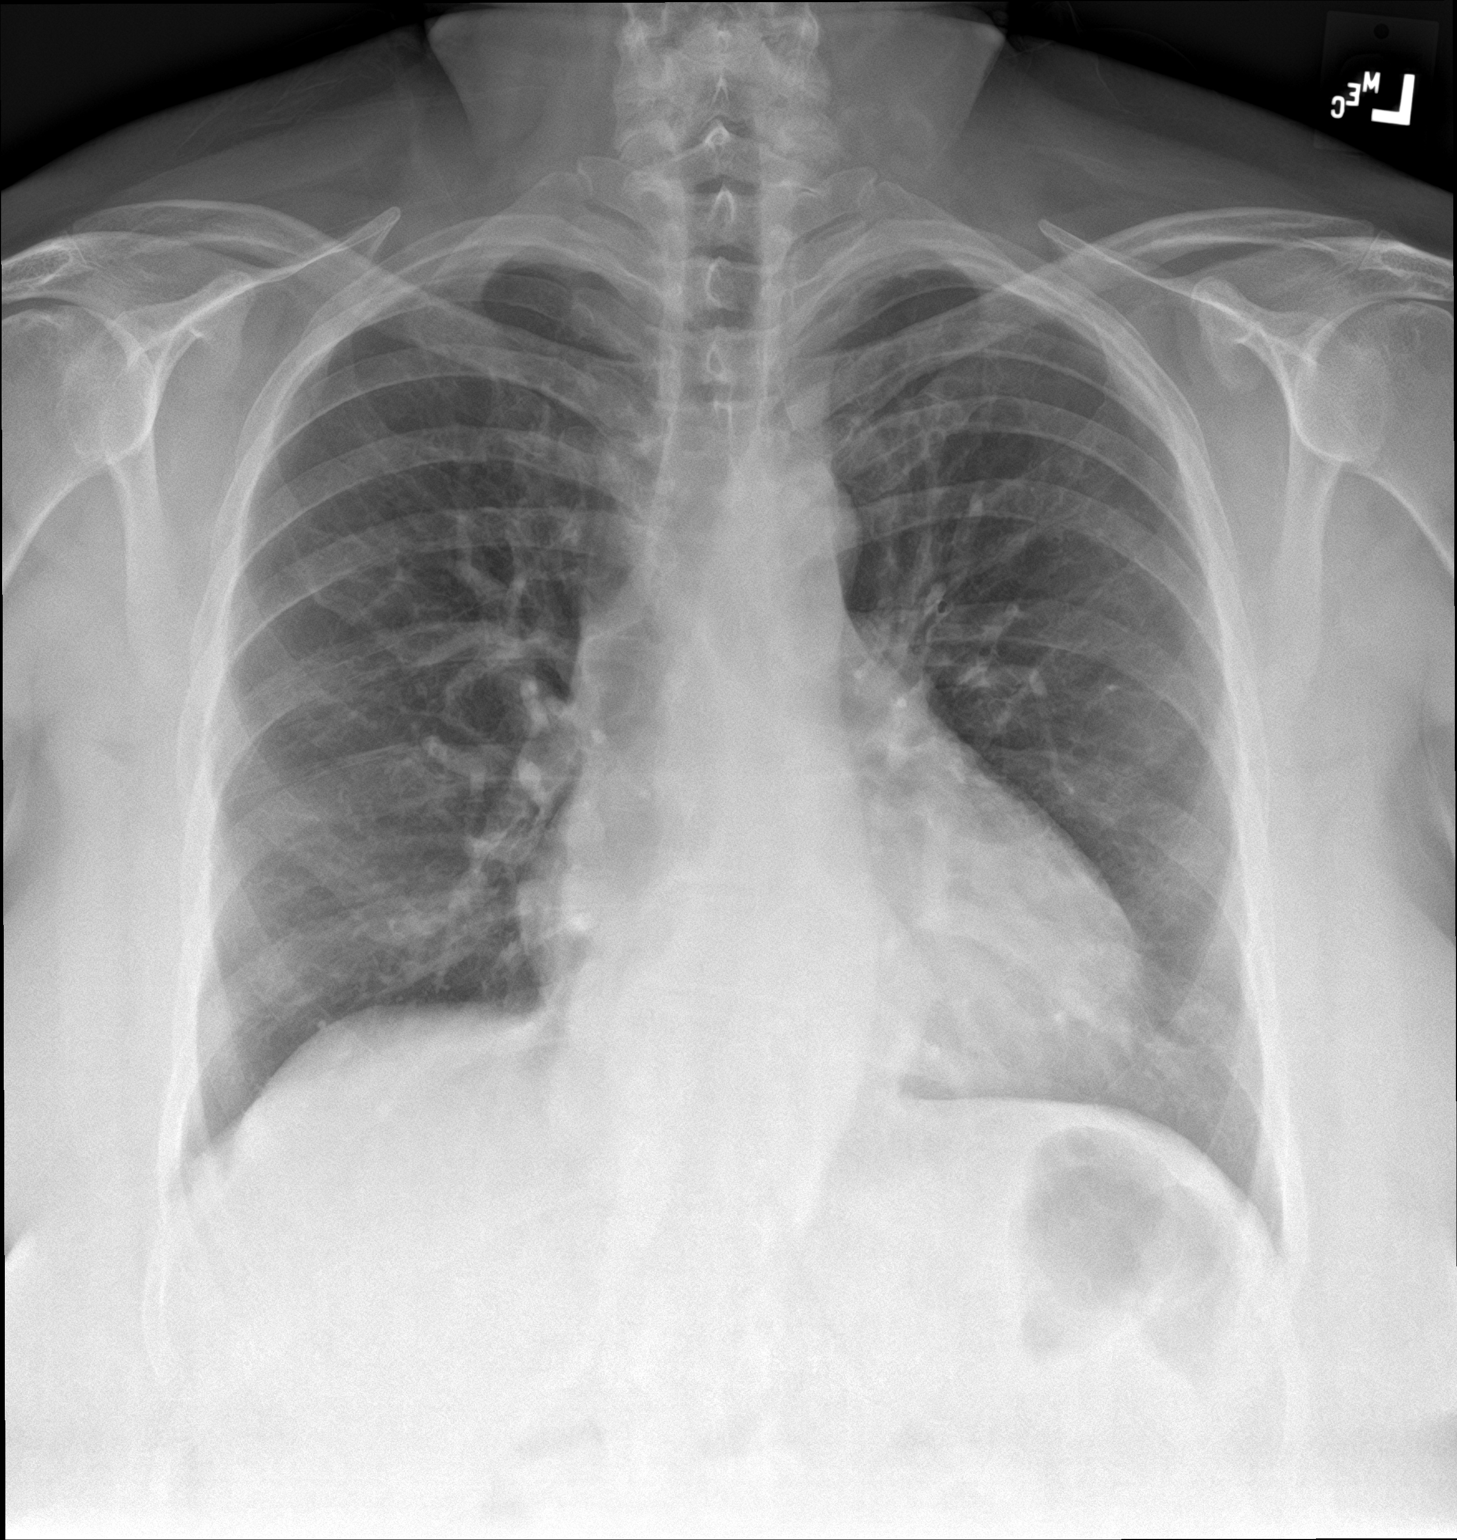

[chest lat]
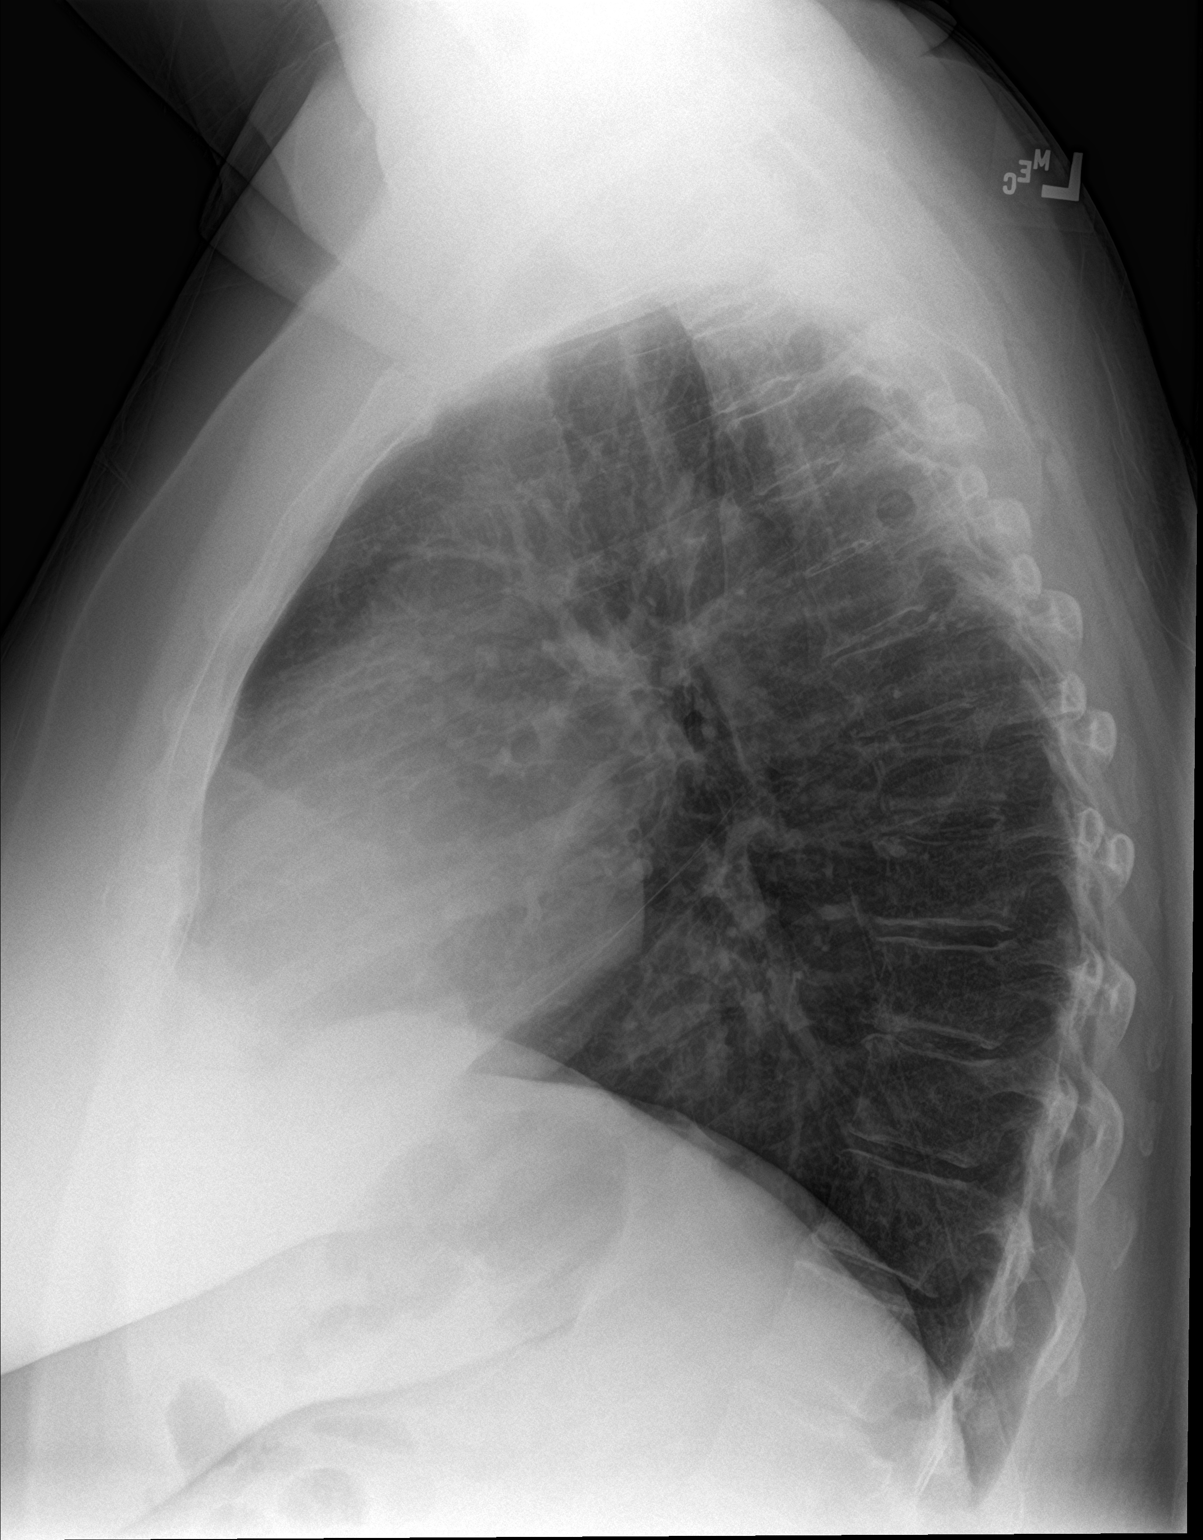

[2 of 2 positions shown; findings below may reference images not displayed]

FINDINGS: Cardiomediastinal silhouette is normal. Mediastinal contours appear
intact.

There is no evidence of focal airspace consolidation, pleural
effusion or pneumothorax.

Osseous structures are without acute abnormality. Soft tissues are
grossly normal.
IMPRESSION: No active cardiopulmonary disease.

## 2019-11-23 ENCOUNTER — Telehealth: Payer: Self-pay | Admitting: Gastroenterology

## 2019-11-23 NOTE — Telephone Encounter (Signed)
Can you please call and schedule her office visit for his next available. Thank you!

## 2019-11-23 NOTE — Telephone Encounter (Signed)
Patient calling to schedule recall colon and ov to discuss IBS. Pt last seen by Dr. Bonna Hurst for procedure in 2019 and ov on 9.16.2020, but pt requesting to returning all GI services to Dr. Allen Hurst. Are you okay with pt returning to you? And if so what would you like to sch? Thanks

## 2019-11-23 NOTE — Telephone Encounter (Signed)
That would be fine with me.  

## 2019-11-24 NOTE — Telephone Encounter (Signed)
Spoke with pt and schedule ov for 10.27.21 in [place] location.

## 2020-01-05 ENCOUNTER — Other Ambulatory Visit: Payer: Self-pay

## 2020-01-11 ENCOUNTER — Other Ambulatory Visit: Payer: Self-pay

## 2020-01-11 ENCOUNTER — Encounter: Payer: Self-pay | Admitting: Gastroenterology

## 2020-01-11 ENCOUNTER — Ambulatory Visit: Payer: 59 | Admitting: Gastroenterology

## 2020-01-11 VITALS — BP 135/89 | HR 105 | Ht 70.0 in | Wt 251.6 lb

## 2020-01-11 DIAGNOSIS — K580 Irritable bowel syndrome with diarrhea: Secondary | ICD-10-CM | POA: Diagnosis not present

## 2020-01-11 MED ORDER — DIPHENOXYLATE-ATROPINE 2.5-0.025 MG PO TABS: 1.0000 | ORAL_TABLET | Freq: Four times a day (QID) | ORAL | 0 refills | Status: DC | PRN

## 2020-01-11 MED ORDER — CLENPIQ 10-3.5-12 MG-GM -GM/160ML PO SOLN: 320.0000 mg | ORAL | 0 refills | Status: DC

## 2020-01-11 NOTE — Progress Notes (Signed)
Primary Care Physician: Hortencia Pilar, MD  Primary Gastroenterologist:  Dr. Lucilla Lame  Chief Complaint  Patient presents with  . Follow up IBS    HPI: Tina Hurst is a 59 y.o. female here with a history of IBS.  The patient had a colonoscopy as an inpatient back in 2019 by Dr. Albertina Parr with a 10 mm polyp removed from the cecum.  The patient was recommended to have a repeat colonoscopy in 2 years due to the large polyp and a fair prep.  The patient had seen me in 2018 with a history of diarrhea predominant irritable bowel syndrome.  At that time the patient was doing well on Lotronex without any issues. She ha been doing well taking Questran. She stopped the Lotrenex due to insurance.  She also reports that her sister has had a cholecystectomy and also has diarrhea and takes Questran.  The patient reports that she had her gallbladder out 6 years ago and that is when her symptoms got worse.  The patient also reports that she can have good days but then if she has to go out of the house like today when she was coming to his appointment she gets her nervous that she has to go the bathroom multiple times before she leaves the house.  She also states that she is unable to go out to dinner most times because of the same fears of having diarrhea when she goes out.  There is no report of any unexplained weight loss fevers chills nausea vomiting black stools or bloody stools.  Past Medical History:  Diagnosis Date  . Diverticulosis   . GERD (gastroesophageal reflux disease)   . Hyperglycemia   . Hypertension   . IBS (irritable bowel syndrome)   . Mitral valve prolapse    followed by PCP  . Psoriasis   . Wears contact lenses     Current Outpatient Medications  Medication Sig Dispense Refill  . Calcium Carb-Cholecalciferol (CALCIUM 600 + D PO) Take 1 tablet by mouth daily.     . carvedilol (COREG) 25 MG tablet Take 25 mg by mouth 2 (two) times daily.    . Cholecalciferol (VITAMIN  D-1000 MAX ST) 1000 units tablet Take 1,000 Units by mouth daily.     . Chromium 200 MCG TABS Take 1 mg by mouth daily.     Marland Kitchen losartan-hydrochlorothiazide (HYZAAR) 100-12.5 MG tablet Take 1 tablet by mouth daily.    . metFORMIN (GLUCOPHAGE) 500 MG tablet Take 500 mg by mouth 2 (two) times daily with a meal.  11  . Multiple Vitamin (MULTIVITAMIN WITH MINERALS) TABS tablet Take 1 tablet by mouth daily.    Marland Kitchen omeprazole (PRILOSEC) 20 MG capsule Take 1 capsule (20 mg total) by mouth daily. 30 capsule 2  . PRESCRIPTION MEDICATION Take 1 capsule by mouth daily. Pt takes a compounded estrogen capsule.  Biestrogen (80/20) and Progesterone 0.4mg /50mg .    . tobramycin-dexamethasone (TOBRADEX) ophthalmic solution     . cholestyramine (QUESTRAN) 4 g packet Take 1 packet (4 g total) by mouth daily. 90 packet 1  . ferrous sulfate 325 (65 FE) MG tablet Take 1 tablet (325 mg total) by mouth 2 (two) times daily with a meal. (Patient not taking: Reported on 12/01/2018) 60 tablet 2   No current facility-administered medications for this visit.    Allergies as of 01/11/2020 - Review Complete 01/11/2020  Allergen Reaction Noted  . Augmentin [amoxicillin-pot clavulanate] Nausea And Vomiting and Other (See Comments) 10/29/2014  .  Amlodipine Swelling and Palpitations 06/23/2012  . Ginger Rash 12/29/2014  . Sulfa antibiotics Rash 10/29/2014    ROS:  General: Negative for anorexia, weight loss, fever, chills, fatigue, weakness. ENT: Negative for hoarseness, difficulty swallowing , nasal congestion. CV: Negative for chest pain, angina, palpitations, dyspnea on exertion, peripheral edema.  Respiratory: Negative for dyspnea at rest, dyspnea on exertion, cough, sputum, wheezing.  GI: See history of present illness. GU:  Negative for dysuria, hematuria, urinary incontinence, urinary frequency, nocturnal urination.  Endo: Negative for unusual weight change.    Physical Examination:   BP 135/89   Pulse (!) 105   Ht  5\' 10"  (1.778 m)   Wt 251 lb 9.6 oz (114.1 kg)   BMI 36.10 kg/m   General: Well-nourished, well-developed in no acute distress.  Eyes: No icterus. Conjunctivae pink. Lungs: Clear to auscultation bilaterally. Non-labored. Heart: Regular rate and rhythm, no murmurs rubs or gallops.  Abdomen: Bowel sounds are normal, nontender, nondistended, no hepatosplenomegaly or masses, no abdominal bruits or hernia , no rebound or guarding.   Extremities: No lower extremity edema. No clubbing or deformities. Neuro: Alert and oriented x 3.  Grossly intact. Skin: Warm and dry, no jaundice.   Psych: Alert and cooperative, normal mood and affect.  Labs:    Imaging Studies: No results found.  Assessment and Plan:   Tina Hurst is a 59 y.o. y/o female who comes in with a history of irritable bowel syndrome with diarrhea.  The patient is due for colonoscopy since she had a colonoscopy with a 10 mm polyp and a fair prep in 2019.  The patient states that her insurance is changed and she is unlikely to have Lotronex covered.  The patient is now on Questran and takes fiber intermittently.  The patient has been told to increase her fiber and increase her Imodium to be taken prophylactically before she leaves the house to help keep her diarrhea under control.  The patient will also be started on Lomotil.  She will be set up for colonoscopy due to her history of colon polyps.  The patient has been explained the plan agrees with it.    Lucilla Lame, MD. [name]    Note: This dictation was prepared with Dragon dictation along with smaller phrase technology. Any transcriptional errors that result from this process are unintentional.

## 2020-01-16 ENCOUNTER — Other Ambulatory Visit: Payer: Self-pay

## 2020-01-16 ENCOUNTER — Encounter: Payer: Self-pay | Admitting: Physician Assistant

## 2020-01-16 MED ORDER — DIPHENOXYLATE-ATROPINE 2.5-0.025 MG PO TABS: 1.0000 | ORAL_TABLET | Freq: Four times a day (QID) | ORAL | 3 refills | Status: DC | PRN

## 2020-01-24 ENCOUNTER — Other Ambulatory Visit: Payer: Self-pay

## 2020-01-24 MED ORDER — DIPHENOXYLATE-ATROPINE 2.5-0.025 MG PO TABS: 1.0000 | ORAL_TABLET | Freq: Four times a day (QID) | ORAL | 3 refills | Status: DC | PRN

## 2020-01-30 ENCOUNTER — Encounter: Payer: Self-pay | Admitting: Gastroenterology

## 2020-01-30 ENCOUNTER — Other Ambulatory Visit: Payer: Self-pay

## 2020-02-01 ENCOUNTER — Other Ambulatory Visit
Admission: RE | Admit: 2020-02-01 | Discharge: 2020-02-01 | Disposition: A | Discharge status: Home / Self Care | Payer: 59 | Source: Ambulatory Visit | Attending: Gastroenterology | Admitting: Gastroenterology

## 2020-02-01 ENCOUNTER — Other Ambulatory Visit: Payer: Self-pay

## 2020-02-01 DIAGNOSIS — Z20822 Contact with and (suspected) exposure to covid-19: Secondary | ICD-10-CM | POA: Diagnosis not present

## 2020-02-01 DIAGNOSIS — Z01812 Encounter for preprocedural laboratory examination: Secondary | ICD-10-CM | POA: Diagnosis present

## 2020-02-01 LAB — SARS CORONAVIRUS 2 (TAT 6-24 HRS): SARS Coronavirus 2: NEGATIVE

## 2020-02-02 NOTE — Discharge Instructions (Signed)
General Anesthesia, Adult, Care After This sheet gives you information about how to care for yourself after your procedure. Your health care provider may also give you more specific instructions. If you have problems or questions, contact your health care provider. What can I expect after the procedure? After the procedure, the following side effects are common:  Pain or discomfort at the IV site.  Nausea.  Vomiting.  Sore throat.  Trouble concentrating.  Feeling cold or chills.  Weak or tired.  Sleepiness and fatigue.  Soreness and body aches. These side effects can affect parts of the body that were not involved in surgery. Follow these instructions at home:  For at least 24 hours after the procedure:  Have a responsible adult stay with you. It is important to have someone help care for you until you are awake and alert.  Rest as needed.  Do not: ? Participate in activities in which you could fall or become injured. ? Drive. ? Use heavy machinery. ? Drink alcohol. ? Take sleeping pills or medicines that cause drowsiness. ? Make important decisions or sign legal documents. ? Take care of children on your own. Eating and drinking  Follow any instructions from your health care provider about eating or drinking restrictions.  When you feel hungry, start by eating small amounts of foods that are soft and easy to digest (bland), such as toast. Gradually return to your regular diet.  Drink enough fluid to keep your urine pale yellow.  If you vomit, rehydrate by drinking water, juice, or clear broth. General instructions  If you have sleep apnea, surgery and certain medicines can increase your risk for breathing problems. Follow instructions from your health care provider about wearing your sleep device: ? Anytime you are sleeping, including during daytime naps. ? While taking prescription pain medicines, sleeping medicines, or medicines that make you drowsy.  Return to  your normal activities as told by your health care provider. Ask your health care provider what activities are safe for you.  Take over-the-counter and prescription medicines only as told by your health care provider.  If you smoke, do not smoke without supervision.  Keep all follow-up visits as told by your health care provider. This is important. Contact a health care provider if:  You have nausea or vomiting that does not get better with medicine.  You cannot eat or drink without vomiting.  You have pain that does not get better with medicine.  You are unable to pass urine.  You develop a skin rash.  You have a fever.  You have redness around your IV site that gets worse. Get help right away if:  You have difficulty breathing.  You have chest pain.  You have blood in your urine or stool, or you vomit blood. Summary  After the procedure, it is common to have a sore throat or nausea. It is also common to feel tired.  Have a responsible adult stay with you for the first 24 hours after general anesthesia. It is important to have someone help care for you until you are awake and alert.  When you feel hungry, start by eating small amounts of foods that are soft and easy to digest (bland), such as toast. Gradually return to your regular diet.  Drink enough fluid to keep your urine pale yellow.  Return to your normal activities as told by your health care provider. Ask your health care provider what activities are safe for you. This information is not   intended to replace advice given to you by your health care provider. Make sure you discuss any questions you have with your health care provider. Document Revised: 03/06/2017 Document Reviewed: 10/17/2016 Elsevier Patient Education  2020 Elsevier Inc.  

## 2020-02-03 ENCOUNTER — Ambulatory Visit: Payer: 59 | Admitting: Anesthesiology

## 2020-02-03 ENCOUNTER — Other Ambulatory Visit: Payer: Self-pay

## 2020-02-03 ENCOUNTER — Ambulatory Visit
Admission: RE | Admit: 2020-02-03 | Discharge: 2020-02-03 | Disposition: A | Discharge status: Home / Self Care | Payer: 59 | Attending: Gastroenterology | Admitting: Gastroenterology

## 2020-02-03 ENCOUNTER — Encounter: Payer: Self-pay | Admitting: Gastroenterology

## 2020-02-03 ENCOUNTER — Encounter
Admission: RE | Disposition: A | Discharge status: Home / Self Care | Payer: Self-pay | Source: Home / Self Care | Attending: Gastroenterology

## 2020-02-03 DIAGNOSIS — K5730 Diverticulosis of large intestine without perforation or abscess without bleeding: Secondary | ICD-10-CM | POA: Diagnosis not present

## 2020-02-03 DIAGNOSIS — Z7984 Long term (current) use of oral hypoglycemic drugs: Secondary | ICD-10-CM | POA: Insufficient documentation

## 2020-02-03 DIAGNOSIS — K635 Polyp of colon: Secondary | ICD-10-CM

## 2020-02-03 DIAGNOSIS — Z87891 Personal history of nicotine dependence: Secondary | ICD-10-CM | POA: Insufficient documentation

## 2020-02-03 DIAGNOSIS — D122 Benign neoplasm of ascending colon: Secondary | ICD-10-CM | POA: Diagnosis not present

## 2020-02-03 DIAGNOSIS — Z79899 Other long term (current) drug therapy: Secondary | ICD-10-CM | POA: Diagnosis not present

## 2020-02-03 DIAGNOSIS — K640 First degree hemorrhoids: Secondary | ICD-10-CM | POA: Diagnosis not present

## 2020-02-03 DIAGNOSIS — K580 Irritable bowel syndrome with diarrhea: Secondary | ICD-10-CM

## 2020-02-03 DIAGNOSIS — Z1211 Encounter for screening for malignant neoplasm of colon: Secondary | ICD-10-CM | POA: Insufficient documentation

## 2020-02-03 DIAGNOSIS — Z860100 Personal history of colon polyps, unspecified: Secondary | ICD-10-CM

## 2020-02-03 HISTORY — PX: POLYPECTOMY: SHX5525

## 2020-02-03 HISTORY — PX: COLONOSCOPY WITH PROPOFOL: SHX5780

## 2020-02-03 LAB — GLUCOSE, CAPILLARY
Glucose-Capillary: 78 mg/dL (ref 70–99)
Glucose-Capillary: 127 mg/dL — ABNORMAL HIGH (ref 70–99)

## 2020-02-03 SURGERY — COLONOSCOPY WITH PROPOFOL
Anesthesia: General | Site: Rectum

## 2020-02-03 MED ORDER — LIDOCAINE HCL (CARDIAC) PF 100 MG/5ML IV SOSY
PREFILLED_SYRINGE | INTRAVENOUS | Status: DC | PRN
  Administered 2020-02-03: 30 mg via INTRAVENOUS

## 2020-02-03 MED ORDER — OXYCODONE HCL 5 MG/5ML PO SOLN: 5.0000 mg | Freq: Once | ORAL | Status: DC | PRN

## 2020-02-03 MED ORDER — SODIUM CHLORIDE 0.9 % IV SOLN: INTRAVENOUS | Status: DC

## 2020-02-03 MED ORDER — OXYCODONE HCL 5 MG PO TABS: 5.0000 mg | ORAL_TABLET | Freq: Once | ORAL | Status: DC | PRN

## 2020-02-03 MED ORDER — STERILE WATER FOR IRRIGATION IR SOLN: Status: DC | PRN

## 2020-02-03 MED ORDER — LACTATED RINGERS IV SOLN: INTRAVENOUS | Status: DC

## 2020-02-03 MED ORDER — PROPOFOL 10 MG/ML IV BOLUS
INTRAVENOUS | Status: DC | PRN
  Administered 2020-02-03: 100 mg via INTRAVENOUS
  Administered 2020-02-03: 40 mg via INTRAVENOUS
  Administered 2020-02-03: 30 mg via INTRAVENOUS
  Administered 2020-02-03: 20 mg via INTRAVENOUS
  Administered 2020-02-03: 40 mg via INTRAVENOUS
  Administered 2020-02-03: 30 mg via INTRAVENOUS

## 2020-02-03 SURGICAL SUPPLY — 7 items
FORCEPS BIOP RAD 4 LRG CAP 4 (CUTTING FORCEPS) ×2 IMPLANT
GOWN CVR UNV OPN BCK APRN NK (MISCELLANEOUS) ×2 IMPLANT
GOWN ISOL THUMB LOOP REG UNIV (MISCELLANEOUS) ×2
KIT PRC NS LF DISP ENDO (KITS) ×1 IMPLANT
KIT PROCEDURE OLYMPUS (KITS) ×1
MANIFOLD NEPTUNE II (INSTRUMENTS) ×2 IMPLANT
WATER STERILE IRR 250ML POUR (IV SOLUTION) ×2 IMPLANT

## 2020-02-03 NOTE — Op Note (Signed)
[facility] Gastroenterology Patient Name: Tina Hurst Procedure Date: 02/03/2020 8:54 AM MRN: [id number] Account #: [id number] Date of Birth: September 15, 1960 Admit Type: Outpatient Age: 59 Room: [facility] OR ROOM 01 Gender: Female Note Status: Finalized Procedure:             Colonoscopy Indications:           High risk colon cancer surveillance: Personal history                         of colonic polyps Providers:             Lucilla Lame MD, MD Referring MD:          Kerin Perna MD, MD (Referring MD) Medicines:             Propofol per Anesthesia Complications:         No immediate complications. Procedure:             Pre-Anesthesia Assessment:                        - Prior to the procedure, a History and Physical was                         performed, and patient medications and allergies were                         reviewed. The patient's tolerance of previous                         anesthesia was also reviewed. The risks and benefits                         of the procedure and the sedation options and risks                         were discussed with the patient. All questions were                         answered, and informed consent was obtained. Prior                         Anticoagulants: The patient has taken no previous                         anticoagulant or antiplatelet agents. ASA Grade                         Assessment: II - A patient with mild systemic disease.                         After reviewing the risks and benefits, the patient                         was deemed in satisfactory condition to undergo the                         procedure.  After obtaining informed consent, the colonoscope was                         passed under direct vision. Throughout the procedure,                         the patient's blood pressure, pulse, and oxygen                         saturations were monitored continuously. The                          Colonoscope was introduced through the anus and                         advanced to the the cecum, identified by appendiceal                         orifice and ileocecal valve. The colonoscopy was                         performed without difficulty. The patient tolerated                         the procedure well. The quality of the bowel                         preparation was excellent. Findings:      The perianal and digital rectal examinations were normal.      Two sessile polyps were found in the ascending colon. The polyps were 3       to 4 mm in size. These polyps were removed with a cold biopsy forceps.       Resection and retrieval were complete.      Multiple small-mouthed diverticula were found in the sigmoid colon and       descending colon.      Non-bleeding internal hemorrhoids were found during retroflexion. The       hemorrhoids were Grade I (internal hemorrhoids that do not prolapse). Impression:            - Two 3 to 4 mm polyps in the ascending colon, removed                         with a cold biopsy forceps. Resected and retrieved.                        - Diverticulosis in the sigmoid colon and in the                         descending colon.                        - Non-bleeding internal hemorrhoids. Recommendation:        - Discharge patient to home.                        - Resume previous diet.                        -  Continue present medications.                        - Await pathology results.                        - Repeat colonoscopy in 7 years for surveillance. Procedure Code(s):     --- Professional ---                        [phone], Colonoscopy, flexible; with biopsy, single or                         multiple Diagnosis Code(s):     --- Professional ---                        Z86.010, Personal history of colonic polyps                        K63.5, Polyp of colon CPT copyright 2019 American Medical Association. All rights  reserved. The codes documented in this report are preliminary and upon coder review may  be revised to meet current compliance requirements. Lucilla Lame MD, MD 02/03/2020 9:17:22 AM This report has been signed electronically. Number of Addenda: 0 Note Initiated On: 02/03/2020 8:54 AM Scope Withdrawal Time: 0 hours 6 minutes 38 seconds  Total Procedure Duration: 0 hours 10 minutes 9 seconds  Estimated Blood Loss:  Estimated blood loss: none.      [facility] [address]

## 2020-02-03 NOTE — Anesthesia Procedure Notes (Signed)
Date/Time: 02/03/2020 9:02 AM Performed by: Cameron Ali, CRNA Pre-anesthesia Checklist: Patient identified, Emergency Drugs available, Suction available, Timeout performed and Patient being monitored Patient Re-evaluated:Patient Re-evaluated prior to induction Oxygen Delivery Method: Nasal cannula Placement Confirmation: positive ETCO2

## 2020-02-03 NOTE — H&P (Signed)
Lucilla Lame, MD [address] [place], [place] [address] Phone:[phone] Fax : [phone]  Primary Care Physician:  Hortencia Pilar, MD Primary Gastroenterologist:  Dr. Allen Norris  Pre-Procedure History & Physical: HPI:  Tina Hurst is a 59 y.o. female is here for an colonoscopy.   Past Medical History:  Diagnosis Date  . Diverticulosis   . GERD (gastroesophageal reflux disease)   . Hyperglycemia   . Hypertension   . IBS (irritable bowel syndrome)   . Mitral valve prolapse    followed by PCP  . Psoriasis   . Wears contact lenses     Past Surgical History:  Procedure Laterality Date  . CHOLECYSTECTOMY    . COLONOSCOPY Left 02/10/2018   Procedure: COLONOSCOPY;  Surgeon: Virgel Manifold, MD;  Location: [facility] ENDOSCOPY;  Service: Endoscopy;  Laterality: Left;  . COLONOSCOPY WITH PROPOFOL N/A 01/05/2015   Procedure: COLONOSCOPY WITH PROPOFOL;  Surgeon: Lucilla Lame, MD;  Location: [place];  Service: Endoscopy;  Laterality: N/A;  . ESOPHAGOGASTRODUODENOSCOPY N/A 02/09/2018   Procedure: ESOPHAGOGASTRODUODENOSCOPY (EGD);  Surgeon: Virgel Manifold, MD;  Location: [facility] ENDOSCOPY;  Service: Endoscopy;  Laterality: N/A;  . LAPAROSCOPIC TUBAL LIGATION    . POLYPECTOMY  01/05/2015   Procedure: POLYPECTOMY;  Surgeon: Lucilla Lame, MD;  Location: [place];  Service: Endoscopy;;    Prior to Admission medications   Medication Sig Start Date End Date Taking? Authorizing Provider  Calcium Carb-Cholecalciferol (CALCIUM 600 + D PO) Take 1 tablet by mouth daily.    Yes [provider]  carvedilol (COREG) 25 MG tablet Take 25 mg by mouth 2 (two) times daily.   Yes [provider]  Cholecalciferol (VITAMIN D-1000 MAX ST) 1000 units tablet Take 1,000 Units by mouth daily.    Yes [provider]  cholestyramine (QUESTRAN) 4 g packet Take 1 packet (4 g total) by mouth daily. 12/01/18 01/30/20 Yes Tahiliani, Margretta Sidle B, MD   Chromium 200 MCG TABS Take 1 mg by mouth daily.    Yes [provider]  diphenoxylate-atropine (LOMOTIL) 2.5-0.025 MG tablet Take 1 tablet by mouth 4 (four) times daily as needed for diarrhea or loose stools. 01/24/20 01/18/21 Yes Lucilla Lame, MD  losartan-hydrochlorothiazide (HYZAAR) 100-12.5 MG tablet Take 1 tablet by mouth daily.   Yes [provider]  metFORMIN (GLUCOPHAGE) 500 MG tablet Take 500 mg by mouth 2 (two) times daily with a meal. 02/15/18  Yes [provider]  Multiple Vitamin (MULTIVITAMIN WITH MINERALS) TABS tablet Take 1 tablet by mouth daily.   Yes [provider]  omeprazole (PRILOSEC) 20 MG capsule Take 1 capsule (20 mg total) by mouth daily. 02/23/18  Yes Earlie Server, MD  ferrous sulfate 325 (65 FE) MG tablet Take 1 tablet (325 mg total) by mouth 2 (two) times daily with a meal. Patient not taking: Reported on 12/01/2018 02/23/18   Earlie Server, MD  PRESCRIPTION MEDICATION Take 1 capsule by mouth daily. Pt takes a compounded estrogen capsule.  Biestrogen (80/20) and Progesterone 0.4mg /50mg . Patient not taking: Reported on 01/30/2020    [provider]  Sod Picosulfate-Mag Ox-Cit Acd (CLENPIQ) 10-3.5-12 MG-GM -GM/160ML SOLN Take 320 mg by mouth as directed. 01/11/20   Lucilla Lame, MD  tobramycin-dexamethasone [facility]) ophthalmic solution  11/22/19   [provider]    Allergies as of 01/11/2020 - Review Complete 01/11/2020  Allergen Reaction Noted  . Augmentin [amoxicillin-pot clavulanate] Nausea And Vomiting and Other (See Comments) 10/29/2014  . Amlodipine Swelling and Palpitations 06/23/2012  .  Ginger Rash 12/29/2014  . Sulfa antibiotics Rash 10/29/2014    Family History  Problem Relation Age of Onset  . Irritable bowel syndrome Sister   . Hypertension Mother   . Diabetes Mother   . Hypertension Father   . Alcohol abuse Father   . COPD Brother     Social History   Socioeconomic History  . Marital status: Married     Spouse name: Not on file  . Number of children: Not on file  . Years of education: Not on file  . Highest education level: Not on file  Occupational History  . Not on file  Tobacco Use  . Smoking status: Former Smoker    Quit date: 2006    Years since quitting: 15.8  . Smokeless tobacco: Never Used  . Tobacco comment:    Vaping Use  . Vaping Use: Never used  Substance and Sexual Activity  . Alcohol use: Yes    Alcohol/week: 3.0 standard drinks    Types: 1 Glasses of wine, 1 Cans of beer, 1 Shots of liquor per week    Comment: occassional - 4 drinks a week  . Drug use: No  . Sexual activity: Not on file  Other Topics Concern  . Not on file  Social History Narrative  . Not on file   Social Determinants of Health   Financial Resource Strain:   . Difficulty of Paying Living Expenses: Not on file  Food Insecurity:   . Worried About Charity fundraiser in the Last Year: Not on file  . Ran Out of Food in the Last Year: Not on file  Transportation Needs:   . Lack of Transportation (Medical): Not on file  . Lack of Transportation (Non-Medical): Not on file  Physical Activity:   . Days of Exercise per Week: Not on file  . Minutes of Exercise per Session: Not on file  Stress:   . Feeling of Stress : Not on file  Social Connections:   . Frequency of Communication with Friends and Family: Not on file  . Frequency of Social Gatherings with Friends and Family: Not on file  . Attends Religious Services: Not on file  . Active Member of Clubs or Organizations: Not on file  . Attends Archivist Meetings: Not on file  . Marital Status: Not on file  Intimate Partner Violence:   . Fear of Current or Ex-Partner: Not on file  . Emotionally Abused: Not on file  . Physically Abused: Not on file  . Sexually Abused: Not on file    Review of Systems: See HPI, otherwise negative ROS  Physical Exam: BP (!) 155/92   Pulse 80   Temp (!) 97 F (36.1 C) (Temporal)   Ht 5'  10" (1.778 m)   Wt 116.8 kg   SpO2 100%   BMI 36.96 kg/m  General:   Alert,  pleasant and cooperative in NAD Head:  Normocephalic and atraumatic. Neck:  Supple; no masses or thyromegaly. Lungs:  Clear throughout to auscultation.    Heart:  Regular rate and rhythm. Abdomen:  Soft, nontender and nondistended. Normal bowel sounds, without guarding, and without rebound.   Neurologic:  Alert and  oriented x4;  grossly normal neurologically.  Impression/Plan: Tina Hurst is here for an colonoscopy to be performed for a history of adenomatous polyps on 01/2018   Risks, benefits, limitations, and alternatives regarding  colonoscopy have been reviewed with the patient.  Questions have been answered.  All parties  agreeable.   Lucilla Lame, MD  02/03/2020, 8:53 AM

## 2020-02-03 NOTE — Anesthesia Preprocedure Evaluation (Signed)
Anesthesia Evaluation  Patient identified by MRN, date of birth, ID band Patient awake    Reviewed: NPO status   History of Anesthesia Complications Negative for: history of anesthetic complications  Airway Mallampati: II  TM Distance: >3 FB Neck ROM: full    Dental no notable dental hx.    Pulmonary neg pulmonary ROS, former smoker,    Pulmonary exam normal        Cardiovascular Exercise Tolerance: Good hypertension, Normal cardiovascular exam     Neuro/Psych negative neurological ROS  negative psych ROS   GI/Hepatic Neg liver ROS, GERD  Controlled,ibs   Endo/Other  diabetesMorbid obesity (bmi 37)  Renal/GU negative Renal ROS  negative genitourinary   Musculoskeletal   Abdominal   Peds  Hematology negative hematology ROS (+)   Anesthesia Other Findings Covid: NEG.  cards: 8/21: paraschos;  2D echocardiogram was performed 10/07/2019 which revealed normal left ventricular function, with LVEF 55 to 65%, with mild mitral and tricuspid regurgitation.  Reproductive/Obstetrics                             Anesthesia Physical Anesthesia Plan  ASA: III  Anesthesia Plan: General   Post-op Pain Management:    Induction:   PONV Risk Score and Plan: 2 and TIVA and Propofol infusion  Airway Management Planned:   Additional Equipment:   Intra-op Plan:   Post-operative Plan:   Informed Consent: I have reviewed the patients History and Physical, chart, labs and discussed the procedure including the risks, benefits and alternatives for the proposed anesthesia with the patient or authorized representative who has indicated his/her understanding and acceptance.       Plan Discussed with: CRNA  Anesthesia Plan Comments:         Anesthesia Quick Evaluation

## 2020-02-03 NOTE — Transfer of Care (Signed)
Immediate Anesthesia Transfer of Care Note  Patient: Tina Hurst  Procedure(s) Performed: COLONOSCOPY WITH BIOPSY (N/A Rectum) POLYPECTOMY (N/A Rectum)  Patient Location: PACU  Anesthesia Type: General  Level of Consciousness: awake, alert  and patient cooperative  Airway and Oxygen Therapy: Patient Spontanous Breathing and Patient connected to supplemental oxygen  Post-op Assessment: Post-op Vital signs reviewed, Patient's Cardiovascular Status Stable, Respiratory Function Stable, Patent Airway and No signs of Nausea or vomiting  Post-op Vital Signs: Reviewed and stable  Complications: No complications documented.

## 2020-02-03 NOTE — Anesthesia Postprocedure Evaluation (Signed)
Anesthesia Post Note  Patient: Patti Shorb  Procedure(s) Performed: COLONOSCOPY WITH BIOPSY (N/A Rectum) POLYPECTOMY (N/A Rectum)     Patient location during evaluation: PACU Anesthesia Type: General Level of consciousness: awake and alert Pain management: pain level controlled Vital Signs Assessment: post-procedure vital signs reviewed and stable Respiratory status: spontaneous breathing, nonlabored ventilation, respiratory function stable and patient connected to nasal cannula oxygen Cardiovascular status: blood pressure returned to baseline and stable Postop Assessment: no apparent nausea or vomiting Anesthetic complications: no   No complications documented.  Fidel Levy

## 2020-02-06 ENCOUNTER — Encounter: Payer: Self-pay | Admitting: Gastroenterology

## 2020-02-06 LAB — SURGICAL PATHOLOGY

## 2020-06-20 ENCOUNTER — Other Ambulatory Visit: Payer: Self-pay

## 2020-06-21 ENCOUNTER — Other Ambulatory Visit: Payer: Self-pay

## 2020-06-21 MED ORDER — DIPHENOXYLATE-ATROPINE 2.5-0.025 MG PO TABS: 1.0000 | ORAL_TABLET | Freq: Four times a day (QID) | ORAL | 3 refills | Status: DC | PRN

## 2020-10-22 ENCOUNTER — Other Ambulatory Visit: Payer: Self-pay

## 2020-10-22 MED ORDER — DIPHENOXYLATE-ATROPINE 2.5-0.025 MG PO TABS: 1.0000 | ORAL_TABLET | Freq: Four times a day (QID) | ORAL | 3 refills | Status: AC | PRN

## 2022-01-31 ENCOUNTER — Other Ambulatory Visit: Payer: Self-pay

## 2022-01-31 DIAGNOSIS — D649 Anemia, unspecified: Secondary | ICD-10-CM | POA: Diagnosis present

## 2022-01-31 DIAGNOSIS — R5383 Other fatigue: Secondary | ICD-10-CM | POA: Diagnosis not present

## 2022-01-31 DIAGNOSIS — R0602 Shortness of breath: Secondary | ICD-10-CM | POA: Diagnosis not present

## 2022-01-31 LAB — BASIC METABOLIC PANEL
Anion gap: 9 (ref 5–15)
BUN: 20 mg/dL (ref 8–23)
CO2: 29 mmol/L (ref 22–32)
Calcium: 8.6 mg/dL — ABNORMAL LOW (ref 8.9–10.3)
Chloride: 103 mmol/L (ref 98–111)
Creatinine, Ser: 0.93 mg/dL (ref 0.44–1.00)
GFR, Estimated: >60 mL/min (ref >60)
Glucose, Bld: 159 mg/dL — ABNORMAL HIGH (ref 70–99)
Potassium: 3.1 mmol/L — ABNORMAL LOW (ref 3.5–5.1)
Sodium: 141 mmol/L (ref 135–145)

## 2022-01-31 LAB — CBC
HCT: 24.1 % — ABNORMAL LOW (ref 36.0–46.0)
Hemoglobin: 6.9 g/dL — ABNORMAL LOW (ref 12.0–15.0)
MCH: 24.3 pg — ABNORMAL LOW (ref 26.0–34.0)
MCHC: 28.6 g/dL — ABNORMAL LOW (ref 30.0–36.0)
MCV: 84.9 fL (ref 80.0–100.0)
Platelets: 350 10*3/uL (ref 150–400)
RBC: 2.84 MIL/uL — ABNORMAL LOW (ref 3.87–5.11)
RDW: 16.1 % — ABNORMAL HIGH (ref 11.5–15.5)
WBC: 4.5 10*3/uL (ref 4.0–10.5)
nRBC: 0 % (ref 0.0–0.2)

## 2022-01-31 NOTE — ED Triage Notes (Signed)
Pt referred by pcp for low Hgb, 6.6. denies any source of bleeding. Pt reports was seen at pcp today due to weakness, DOE, and fatigue. Reports intermittent dizziness as well. Pt denies chest pain or pressure. Denies SOB at rest. Pt reports hx of same 3 yrs ago and cause was never determined.   Denies chest pain or pressure.  Pt is alert and oriented following commands appropriately. Breathing unlabored speaking in full sentences with symmetric chest rise and fall. No cough or congestion.

## 2022-02-01 ENCOUNTER — Emergency Department
Admission: EM | Admit: 2022-02-01 | Discharge: 2022-02-01 | Disposition: A | Discharge status: Home / Self Care | Payer: BC Managed Care – PPO | Attending: Emergency Medicine | Admitting: Emergency Medicine

## 2022-02-01 DIAGNOSIS — D649 Anemia, unspecified: Secondary | ICD-10-CM

## 2022-02-01 LAB — URINALYSIS, ROUTINE W REFLEX MICROSCOPIC
Bilirubin Urine: NEGATIVE
Glucose, UA: NEGATIVE mg/dL
Hgb urine dipstick: NEGATIVE
Ketones, ur: NEGATIVE mg/dL
Nitrite: NEGATIVE
Protein, ur: NEGATIVE mg/dL
Specific Gravity, Urine: 1.016 (ref 1.005–1.030)
pH: 6 (ref 5.0–8.0)

## 2022-02-01 LAB — CBG MONITORING, ED: Glucose-Capillary: 127 mg/dL — ABNORMAL HIGH (ref 70–99)

## 2022-02-01 LAB — PREPARE RBC (CROSSMATCH)

## 2022-02-01 MED ORDER — SODIUM CHLORIDE 0.9 % IV SOLN
10.0000 mL/h | Freq: Once | INTRAVENOUS | Status: AC
  Administered 2022-02-01: 10 mL/h via INTRAVENOUS

## 2022-02-01 NOTE — ED Provider Notes (Signed)
[facility] Provider Note    Event Date/Time   First MD Initiated Contact with Patient 02/01/22 0302     (approximate)  History   Chief Complaint: abnormal labs   HPI  Tina Hurst is a 61 y.o. female presents to the emergency department for low blood count.  According to the patient they recently went on a trip to the [place], she noticed that she was getting more fatigued and short of breath with exertion than typical.  She made an appointment with her doctor when they got back where she had lab work done and was told to come to the emergency department due to hemoglobin of 6.8.  Patient states approximately 3 years ago the same thing happened to her where her hemoglobin dropped, she had an upper endoscopy and a colonoscopy and did not find any cause of the bleeding.  Patient states she followed up with a hematologist but does not recall the findings.  She states when she was younger she was diagnosed with pernicious anemia and was on injections for some time but has not had this in many years.  Patient denies any black or bloody stool.  Denies any abdominal pain or vomiting.  Denies any vaginal bleeding or hematuria.  Physical Exam   Triage Vital Signs: ED Triage Vitals  Enc Vitals Group     BP 01/31/22 2235 128/74     Pulse Rate 01/31/22 2235 (!) 126     Resp 01/31/22 2235 18     Temp 01/31/22 2235 98.2 F (36.8 C)     Temp Source 01/31/22 2235 Oral     SpO2 01/31/22 2235 100 %     Weight 01/31/22 2239 240 lb (108.9 kg)     Height 01/31/22 2239 '5\' 10"'$  (1.778 m)     Head Circumference --      Peak Flow --      Pain Score 01/31/22 2236 0     Pain Loc --      Pain Edu? --      Excl. in [place]? --     Most recent vital signs: Vitals:   01/31/22 2235 02/01/22 0248  BP: 128/74 (!) 148/74  Pulse: (!) 126 99  Resp: 18 18  Temp: 98.2 F (36.8 C) 98.4 F (36.9 C)  SpO2: 100% 100%    General: Awake, no distress.  CV:  Good peripheral perfusion.   Regular rate and rhythm  Resp:  Normal effort.  Equal breath sounds bilaterally.  Mild expiratory wheeze bilaterally. Abd:  No distention.  Soft, nontender.  No rebound or guarding.   ED Results / Procedures / Treatments   EKG  EKG viewed and interpreted by myself shows sinus tachycardia at 128 bpm with a narrow QRS, normal axis, normal intervals with nonspecific ST changes.   MEDICATIONS ORDERED IN ED: Medications  0.9 %  sodium chloride infusion (has no administration in time range)     IMPRESSION / MDM / ASSESSMENT AND PLAN / ED COURSE  I reviewed the triage vital signs and the nursing notes.  Patient's presentation is most consistent with acute presentation with potential threat to life or bodily function.  Patient presents emergency department for low hemoglobin.  Recheck in the emergency department shows a hemoglobin of 6.9.  Patient's chemistry is reassuring.  Rectal examination shows light brown stool guaiac negative.  Vital signs are reassuring.  Physical exam reassuring with a benign abdomen.  Given the patient's negative rectal exam, no bleeding on  history I discussed with the patient the need to follow-up with hematologist for further work-up, patient agreeable to plan.  We will transfuse 1 unit of packed red blood cells in the emergency department.  I have verbally consented the patient for blood products.  However given no active bleeding I believe the patient could be discharged home after her transfusion to follow-up with the hematologist.  The patient is agreeable to plan and prefers discharge.  CRITICAL CARE Performed by: Harvest Dark   Total critical care time: 30 minutes  Critical care time was exclusive of separately billable procedures and treating other patients.  Critical care was necessary to treat or prevent imminent or life-threatening deterioration.  Critical care was time spent personally by me on the following activities: development of treatment  plan with patient and/or surrogate as well as nursing, discussions with consultants, evaluation of patient's response to treatment, examination of patient, obtaining history from patient or surrogate, ordering and performing treatments and interventions, ordering and review of laboratory studies, ordering and review of radiographic studies, pulse oximetry and re-evaluation of patient's condition.   FINAL CLINICAL IMPRESSION(S) / ED DIAGNOSES   Symptomatic anemia    Note:  This document was prepared using Dragon voice recognition software and may include unintentional dictation errors.   Harvest Dark, MD 02/01/22 0400

## 2022-02-01 NOTE — Discharge Instructions (Signed)
Please call the number provided for hematology Monday morning to arrange a follow-up appointment within the next several days for recheck/reevaluation.  Return to the emergency department for any black or bloody stool, any chest pain or trouble breathing or significant weakness.

## 2022-02-01 NOTE — ED Notes (Signed)
RBC unit infusing at 134m  without distress, or s/s of reaction.  Pt resting quietly with eyes closed, resp even and regular.  Spouse at bedside

## 2022-02-01 NOTE — ED Notes (Signed)
Infusion rate increased to 261m/hr.

## 2022-02-02 LAB — BPAM RBC
Blood Product Expiration Date: 202312152359
ISSUE DATE / TIME: 202311180427
Unit Type and Rh: 5100

## 2022-02-02 LAB — TYPE AND SCREEN
ABO/RH(D): O POS
Antibody Screen: NEGATIVE
Unit division: 0

## 2022-02-10 ENCOUNTER — Inpatient Hospital Stay: Payer: BC Managed Care – PPO

## 2022-02-10 ENCOUNTER — Encounter: Payer: Self-pay | Admitting: Oncology

## 2022-02-10 ENCOUNTER — Inpatient Hospital Stay: Payer: BC Managed Care – PPO | Attending: Oncology | Admitting: Oncology

## 2022-02-10 VITALS — BP 145/83 | HR 97 | Temp 97.8°F | Resp 20 | Wt 246.1 lb

## 2022-02-10 DIAGNOSIS — D500 Iron deficiency anemia secondary to blood loss (chronic): Secondary | ICD-10-CM

## 2022-02-10 DIAGNOSIS — D509 Iron deficiency anemia, unspecified: Secondary | ICD-10-CM

## 2022-02-10 DIAGNOSIS — I10 Essential (primary) hypertension: Secondary | ICD-10-CM | POA: Diagnosis not present

## 2022-02-10 LAB — CBC
HCT: 28 % — ABNORMAL LOW (ref 36.0–46.0)
Hemoglobin: 7.9 g/dL — ABNORMAL LOW (ref 12.0–15.0)
MCH: 25.6 pg — ABNORMAL LOW (ref 26.0–34.0)
MCHC: 28.2 g/dL — ABNORMAL LOW (ref 30.0–36.0)
MCV: 90.6 fL (ref 80.0–100.0)
Platelets: 270 10*3/uL (ref 150–400)
RBC: 3.09 MIL/uL — ABNORMAL LOW (ref 3.87–5.11)
RDW: 18.3 % — ABNORMAL HIGH (ref 11.5–15.5)
WBC: 4.4 10*3/uL (ref 4.0–10.5)
nRBC: 0 % (ref 0.0–0.2)

## 2022-02-10 LAB — DAT, POLYSPECIFIC AHG (ARMC ONLY): Polyspecific AHG test: NEGATIVE

## 2022-02-10 LAB — IRON AND TIBC
Iron: 386 ug/dL — ABNORMAL HIGH (ref 28–170)
Saturation Ratios: 74 % — ABNORMAL HIGH (ref 10.4–31.8)
TIBC: 519 ug/dL — ABNORMAL HIGH (ref 250–450)
UIBC: 133 ug/dL

## 2022-02-10 LAB — LACTATE DEHYDROGENASE: LDH: 146 U/L (ref 98–192)

## 2022-02-10 LAB — RETICULOCYTES
Immature Retic Fract: 33.7 % — ABNORMAL HIGH (ref 2.3–15.9)
RBC.: 3.08 MIL/uL — ABNORMAL LOW (ref 3.87–5.11)
Retic Count, Absolute: 99.8 10*3/uL (ref 19.0–186.0)
Retic Ct Pct: 3.2 % — ABNORMAL HIGH (ref 0.4–3.1)

## 2022-02-10 LAB — VITAMIN B12: Vitamin B-12: 416 pg/mL (ref 180–914)

## 2022-02-10 LAB — SAMPLE TO BLOOD BANK

## 2022-02-10 LAB — FOLATE: Folate: 18.9 ng/mL (ref >5.9)

## 2022-02-10 LAB — FERRITIN: Ferritin: 7 ng/mL — ABNORMAL LOW (ref 11–307)

## 2022-02-10 NOTE — Progress Notes (Signed)
[place]  [phone] [phone]) [phone]  ID: Tina Hurst OB: [birth year]  MR#: [id number]  QHU#:[id number]  Patient Care Team: [facility] as [facility]CP - General  CHIEF COMPLAINT: Iron deficiency anemia.  INTERVAL HISTORY: Patient is a 61 year old female with a history of iron deficiency anemia who recently required blood transfusion in the emergency room for significantly decreased hemoglobin.  She continues to have chronic fatigue, but feels improved since her transfusion.  She has no neurologic complaints.  She denies any recent fevers or illnesses.  She has a good appetite and denies weight loss.  She has no chest pain, shortness of breath, cough, or hemoptysis.  She denies any nausea, vomiting, constipation, or diarrhea.  She has no melena or hematochezia.  She has no urinary complaints.  Patient offers no further specific complaints today.  REVIEW OF SYSTEMS:   Review of Systems  Constitutional:  Positive for malaise/fatigue. Negative for fever and weight loss.  Respiratory: Negative.  Negative for cough, hemoptysis and shortness of breath.   Cardiovascular: Negative.  Negative for chest pain and leg swelling.  Gastrointestinal: Negative.  Negative for abdominal pain, blood in stool and melena.  Genitourinary: Negative.  Negative for hematuria.  Musculoskeletal: Negative.  Negative for back pain.  Skin: Negative.  Negative for rash.  Neurological: Negative.  Negative for dizziness, focal weakness, weakness and headaches.  Psychiatric/Behavioral: Negative.  The patient is not nervous/anxious.     As per HPI. Otherwise, a complete review of systems is negative.  PAST MEDICAL HISTORY: Past Medical History:  Diagnosis Date   Diverticulosis    GERD (gastroesophageal reflux disease)    Hyperglycemia    Hypertension    IBS (irritable bowel syndrome)    Mitral valve prolapse    followed by PCP   Psoriasis    Wears contact lenses      PAST SURGICAL HISTORY: Past Surgical History:  Procedure Laterality Date   CHOLECYSTECTOMY     COLONOSCOPY Left 02/10/2018   Procedure: COLONOSCOPY;  Surgeon: Virgel Manifold, MD;  Location: [facility] ENDOSCOPY;  Service: Endoscopy;  Laterality: Left;   COLONOSCOPY WITH PROPOFOL N/A 01/05/2015   Procedure: COLONOSCOPY WITH PROPOFOL;  Surgeon: Lucilla Lame, MD;  Location: [place];  Service: Endoscopy;  Laterality: N/A;   COLONOSCOPY WITH PROPOFOL N/A 02/03/2020   Procedure: COLONOSCOPY WITH BIOPSY;  Surgeon: Lucilla Lame, MD;  Location: [place];  Service: Endoscopy;  Laterality: N/A;   ESOPHAGOGASTRODUODENOSCOPY N/A 02/09/2018   Procedure: ESOPHAGOGASTRODUODENOSCOPY (EGD);  Surgeon: Virgel Manifold, MD;  Location: [facility] ENDOSCOPY;  Service: Endoscopy;  Laterality: N/A;   LAPAROSCOPIC TUBAL LIGATION     POLYPECTOMY  01/05/2015   Procedure: POLYPECTOMY;  Surgeon: Lucilla Lame, MD;  Location: [place];  Service: Endoscopy;;   POLYPECTOMY N/A 02/03/2020   Procedure: POLYPECTOMY;  Surgeon: Lucilla Lame, MD;  Location: [place];  Service: Endoscopy;  Laterality: N/A;    FAMILY HISTORY: Family History  Problem Relation Age of Onset   Irritable bowel syndrome Sister    Hypertension Mother    Diabetes Mother    Hypertension Father    Alcohol abuse Father    COPD Brother     ADVANCED DIRECTIVES (Y/N):  N  HEALTH MAINTENANCE: Social History   Tobacco Use   Smoking status: Former    Types: Cigarettes    Quit date: 2006    Years since quitting: 17.9   Smokeless tobacco: Never   Tobacco comments:  Vaping Use   Vaping Use: Never used  Substance Use Topics   Alcohol use: Yes    Alcohol/week: 3.0 standard drinks of alcohol    Types: 1 Glasses of wine, 1 Cans of beer, 1 Shots of liquor per week    Comment: occassional - 4 drinks a week   Drug use: No     Colonoscopy:  PAP:  Bone density:  Lipid panel:  Allergies   Allergen Reactions   Augmentin [Amoxicillin-Pot Clavulanate] Nausea And Vomiting and Other (See Comments)    Pt states that she is able to take '250mg'$  tablet.     Amlodipine Swelling and Palpitations    Other Reaction: SWOLLEN FEET   Ginger Rash   Sulfa Antibiotics Rash    Current Outpatient Medications  Medication Sig Dispense Refill   carvedilol (COREG) 25 MG tablet Take 25 mg by mouth 2 (two) times daily.     diphenoxylate-atropine (LOMOTIL) 2.5-0.025 MG tablet Take 1 tablet by mouth 4 (four) times daily as needed for diarrhea or loose stools.     losartan-hydrochlorothiazide (HYZAAR) 100-12.5 MG tablet Take 1 tablet by mouth daily.     potassium chloride (MICRO-K) 10 MEQ CR capsule Take 10 mEq by mouth 2 (two) times daily. 2 tablets daily     traZODone (DESYREL) 50 MG tablet Take 50 mg by mouth at bedtime.     Calcium Carb-Cholecalciferol (CALCIUM 600 + D PO) Take 1 tablet by mouth daily.  (Patient not taking: Reported on 02/10/2022)     Cholecalciferol (VITAMIN D-1000 MAX ST) 1000 units tablet Take 1,000 Units by mouth daily.  (Patient not taking: Reported on 02/10/2022)     ferrous sulfate 325 (65 FE) MG tablet Take 1 tablet (325 mg total) by mouth 2 (two) times daily with a meal. 60 tablet 2   Multiple Vitamin (MULTIVITAMIN WITH MINERALS) TABS tablet Take 1 tablet by mouth daily. (Patient not taking: Reported on 02/10/2022)     No current facility-administered medications for this visit.    OBJECTIVE: Vitals:   02/10/22 1030  BP: (!) 145/83  Pulse: 97  Resp: 20  Temp: 97.8 F (36.6 C)  SpO2: 100%     Body mass index is 35.31 kg/m.    ECOG FS:0 - Asymptomatic  General: Well-developed, well-nourished, no acute distress. Eyes: Pink conjunctiva, anicteric sclera. HEENT: Normocephalic, moist mucous membranes. Lungs: No audible wheezing or coughing. Heart: Regular rate and rhythm. Abdomen: Soft, nontender, no obvious distention. Musculoskeletal: No edema, cyanosis, or  clubbing. Neuro: Alert, answering all questions appropriately. Cranial nerves grossly intact. Skin: No rashes or petechiae noted. Psych: Normal affect. Lymphatics: No cervical, calvicular, axillary or inguinal LAD.   LAB RESULTS:  Lab Results  Component Value Date   NA 141 01/31/2022   K 3.1 (L) 01/31/2022   CL 103 01/31/2022   CO2 29 01/31/2022   GLUCOSE 159 (H) 01/31/2022   BUN 20 01/31/2022   CREATININE 0.93 01/31/2022   CALCIUM 8.6 (L) 01/31/2022   PROT 7.8 11/01/2014   ALBUMIN 4.5 11/01/2014   AST 51 (H) 11/01/2014   ALT 42 11/01/2014   ALKPHOS 106 11/01/2014   BILITOT 0.5 11/01/2014   GFRNONAA >60 01/31/2022   GFRAA >60 02/09/2018    Lab Results  Component Value Date   WBC 4.4 02/10/2022   NEUTROABS 3.1 02/23/2018   HGB 7.9 (L) 02/10/2022   HCT 28.0 (L) 02/10/2022   MCV 90.6 02/10/2022   PLT 270 02/10/2022   Lab Results  Component Value Date  IRON 386 (H) 02/10/2022   TIBC 519 (H) 02/10/2022   IRONPCTSAT 74 (H) 02/10/2022   Lab Results  Component Value Date   FERRITIN 7 (L) 02/10/2022     STUDIES: No results found.  ASSESSMENT: Iron deficiency anemia.  PLAN:    Iron deficiency anemia: Patient had full GI work-up 3 to 4 years ago that did not reveal any distinct etiology.  Repeat colonoscopy November 2021 revealed 2 polyps, but no other significant pathology.  She has been instructed to keep her previously scheduled follow-up appointment with GI in December.  Patient's hemoglobin has improved to 7.9 with transfusion.  Her ferritin remains significantly low.  Suspect her iron stores are falsely elevated secondary to recent transfusion.  Return to clinic 4 times over the next several weeks to receive 200 mg IV Venofer.  Patient will then return to clinic in 4 months with repeat laboratory work, further evaluation, and consideration of additional IV iron if necessary.  I spent a total of 45 minutes reviewing chart data, face-to-face evaluation with the  patient, counseling and coordination of care as detailed above.   Patient expressed understanding and was in agreement with this plan. She also understands that She can call clinic at any time with any questions, concerns, or complaints.     Lloyd Huger, MD   02/10/2022 2:57 PM

## 2022-02-10 NOTE — Progress Notes (Signed)
Pt had been feeling bad. PCP kept telling her it was due to her UTI. Pt went to ED and received 1 unit of blood. Sweating, stamina low, dyspnea with exertion. Felt lethargic. Feeling better since receiving blood. Pt was seen here in the past. Was on oral iron which was stopped by PCP. IBS is acting up. Pt was diagnosed with pernicious anemia in the early 2000's in [place].

## 2022-02-11 LAB — HAPTOGLOBIN: Haptoglobin: 161 mg/dL (ref 37–355)

## 2022-02-18 ENCOUNTER — Inpatient Hospital Stay: Payer: BC Managed Care – PPO

## 2022-02-19 ENCOUNTER — Inpatient Hospital Stay: Payer: BC Managed Care – PPO | Attending: Oncology

## 2022-02-19 VITALS — BP 105/58 | HR 83 | Temp 96.3°F

## 2022-02-19 DIAGNOSIS — D500 Iron deficiency anemia secondary to blood loss (chronic): Secondary | ICD-10-CM

## 2022-02-19 DIAGNOSIS — D509 Iron deficiency anemia, unspecified: Secondary | ICD-10-CM | POA: Diagnosis not present

## 2022-02-19 MED ORDER — SODIUM CHLORIDE 0.9 % IV SOLN
200.0000 mg | Freq: Once | INTRAVENOUS | Status: AC
  Administered 2022-02-19: 200 mg via INTRAVENOUS
  Filled 2022-02-19: qty 200

## 2022-02-19 MED ORDER — SODIUM CHLORIDE 0.9 % IV SOLN
Freq: Once | INTRAVENOUS | Status: AC
  Filled 2022-02-19: qty 250

## 2022-02-19 NOTE — Patient Instructions (Signed)

## 2022-02-20 ENCOUNTER — Inpatient Hospital Stay: Payer: BC Managed Care – PPO

## 2022-02-21 ENCOUNTER — Inpatient Hospital Stay: Payer: BC Managed Care – PPO

## 2022-02-21 VITALS — BP 121/62 | HR 70 | Temp 97.0°F | Resp 18

## 2022-02-21 DIAGNOSIS — D500 Iron deficiency anemia secondary to blood loss (chronic): Secondary | ICD-10-CM

## 2022-02-21 DIAGNOSIS — D509 Iron deficiency anemia, unspecified: Secondary | ICD-10-CM | POA: Diagnosis not present

## 2022-02-21 MED ORDER — SODIUM CHLORIDE 0.9 % IV SOLN
200.0000 mg | Freq: Once | INTRAVENOUS | Status: AC
  Administered 2022-02-21: 200 mg via INTRAVENOUS
  Filled 2022-02-21: qty 200

## 2022-02-21 MED ORDER — SODIUM CHLORIDE 0.9 % IV SOLN
Freq: Once | INTRAVENOUS | Status: AC
  Filled 2022-02-21: qty 250

## 2022-02-23 ENCOUNTER — Encounter: Payer: Self-pay | Admitting: Oncology

## 2022-02-25 ENCOUNTER — Ambulatory Visit: Payer: BLUE CROSS/BLUE SHIELD

## 2022-02-26 ENCOUNTER — Inpatient Hospital Stay: Payer: BC Managed Care – PPO

## 2022-02-26 ENCOUNTER — Encounter: Payer: Self-pay | Admitting: Gastroenterology

## 2022-02-26 ENCOUNTER — Ambulatory Visit: Payer: BC Managed Care – PPO | Admitting: Gastroenterology

## 2022-02-26 VITALS — BP 121/83 | HR 90 | Temp 97.9°F | Wt 244.0 lb

## 2022-02-26 VITALS — BP 128/75 | HR 78 | Temp 98.2°F | Resp 18

## 2022-02-26 DIAGNOSIS — D500 Iron deficiency anemia secondary to blood loss (chronic): Secondary | ICD-10-CM

## 2022-02-26 DIAGNOSIS — D509 Iron deficiency anemia, unspecified: Secondary | ICD-10-CM | POA: Diagnosis not present

## 2022-02-26 DIAGNOSIS — K580 Irritable bowel syndrome with diarrhea: Secondary | ICD-10-CM | POA: Diagnosis not present

## 2022-02-26 MED ORDER — SODIUM CHLORIDE 0.9 % IV SOLN
200.0000 mg | Freq: Once | INTRAVENOUS | Status: AC
  Administered 2022-02-26: 200 mg via INTRAVENOUS
  Filled 2022-02-26: qty 200

## 2022-02-26 MED ORDER — RIFAXIMIN 550 MG PO TABS: 550.0000 mg | ORAL_TABLET | Freq: Three times a day (TID) | ORAL | 3 refills | Status: DC

## 2022-02-26 MED ORDER — SODIUM CHLORIDE 0.9 % IV SOLN
Freq: Once | INTRAVENOUS | Status: AC
  Filled 2022-02-26: qty 250

## 2022-02-26 NOTE — Progress Notes (Signed)
Primary Care Physician: [facility]  Primary Gastroenterologist:  Dr. Lucilla Lame  Chief Complaint  Patient presents with   Irritable Bowel Syndrome    No relief taking Lomotil    HPI: Tina Hurst is a 61 y.o. female here for follow-up of her irritable bowel syndrome.  The patient has a history of colon polyps and her last colonoscopy in 2021 was recommended to be repeated in 7 years.  The patient had adenomas at that time.  The patient has been on Lomotil for her irritable bowel syndrome. The patient reports that she has frequent diarrhea and was helped in the past by Lotronex but states that it was hard to get due to her insurance.  She also reports that the Lomotil is not working as well as it used to.  She also states that her diarrhea is not only present but explosive in nature.  She has episodes of having to run to the bathroom shortly after eating and feels like the food is running through her.  Despite this she has not had any unexplained weight loss.  Past Medical History:  Diagnosis Date   Diverticulosis    GERD (gastroesophageal reflux disease)    Hyperglycemia    Hypertension    IBS (irritable bowel syndrome)    Mitral valve prolapse    followed by PCP   Psoriasis    Wears contact lenses     Current Outpatient Medications  Medication Sig Dispense Refill   Calcium Carb-Cholecalciferol (CALCIUM 600 + D PO) Take 1 tablet by mouth daily.     carvedilol (COREG) 25 MG tablet Take 25 mg by mouth 2 (two) times daily.     Cholecalciferol (VITAMIN D-1000 MAX ST) 1000 units tablet Take 1,000 Units by mouth daily.     diphenoxylate-atropine (LOMOTIL) 2.5-0.025 MG tablet Take 1 tablet by mouth 4 (four) times daily as needed for diarrhea or loose stools.     losartan-hydrochlorothiazide (HYZAAR) 100-12.5 MG tablet Take 1 tablet by mouth daily.     Multiple Vitamin (MULTIVITAMIN WITH MINERALS) TABS tablet Take 1 tablet by mouth daily.     potassium chloride  (MICRO-K) 10 MEQ CR capsule Take 10 mEq by mouth 2 (two) times daily. 2 tablets daily     traZODone (DESYREL) 50 MG tablet Take 50 mg by mouth at bedtime.     No current facility-administered medications for this visit.    Allergies as of 02/26/2022 - Review Complete 02/26/2022  Allergen Reaction Noted   Augmentin [amoxicillin-pot clavulanate] Nausea And Vomiting and Other (See Comments) 10/29/2014   Amlodipine Swelling and Palpitations 06/23/2012   Ginger Rash 12/29/2014   Sulfa antibiotics Rash 10/29/2014    ROS:  General: Negative for anorexia, weight loss, fever, chills, fatigue, weakness. ENT: Negative for hoarseness, difficulty swallowing , nasal congestion. CV: Negative for chest pain, angina, palpitations, dyspnea on exertion, peripheral edema.  Respiratory: Negative for dyspnea at rest, dyspnea on exertion, cough, sputum, wheezing.  GI: See history of present illness. GU:  Negative for dysuria, hematuria, urinary incontinence, urinary frequency, nocturnal urination.  Endo: Negative for unusual weight change.    Physical Examination:   BP 121/83 (BP Location: Left Arm, Patient Position: Sitting, Cuff Size: Large)   Pulse 90   Temp 97.9 F (36.6 C) (Oral)   Wt 244 lb (110.7 kg)   BMI 35.01 kg/m   General: Well-nourished, well-developed in no acute distress.  Eyes: No icterus. Conjunctivae pink. Neuro: Alert and oriented x 3.  Grossly intact. Skin: Warm and dry, no jaundice.   Psych: Alert and cooperative, normal mood and affect.  Labs:    Imaging Studies: No results found.  Assessment and Plan:   Tina Hurst is a 61 y.o. y/o female Who comes in today with a history of irritable bowel syndrome with diarrhea predominance.  The patient will be started on rifaximin for IBS the.  The patient has been explained the plan and agrees with it.  If the symptoms do not improve we can always try getting her back on the Lotronex.     Lucilla Lame, MD. [name]    Note:  This dictation was prepared with Dragon dictation along with smaller phrase technology. Any transcriptional errors that result from this process are unintentional.

## 2022-02-27 ENCOUNTER — Encounter: Payer: Self-pay | Admitting: Oncology

## 2022-02-28 ENCOUNTER — Ambulatory Visit: Payer: BC Managed Care – PPO

## 2022-03-05 ENCOUNTER — Inpatient Hospital Stay: Payer: BC Managed Care – PPO

## 2022-03-05 VITALS — BP 130/78 | HR 85 | Temp 96.8°F | Resp 18

## 2022-03-05 DIAGNOSIS — D509 Iron deficiency anemia, unspecified: Secondary | ICD-10-CM | POA: Diagnosis not present

## 2022-03-05 DIAGNOSIS — D500 Iron deficiency anemia secondary to blood loss (chronic): Secondary | ICD-10-CM

## 2022-03-05 MED ORDER — SODIUM CHLORIDE 0.9 % IV SOLN
200.0000 mg | Freq: Once | INTRAVENOUS | Status: AC
  Administered 2022-03-05: 200 mg via INTRAVENOUS
  Filled 2022-03-05: qty 200

## 2022-03-05 MED ORDER — SODIUM CHLORIDE 0.9 % IV SOLN
Freq: Once | INTRAVENOUS | Status: AC
  Filled 2022-03-05: qty 250

## 2022-03-05 NOTE — Patient Instructions (Signed)

## 2022-03-12 ENCOUNTER — Encounter: Payer: Self-pay | Admitting: Gastroenterology

## 2022-03-13 ENCOUNTER — Telehealth: Payer: Self-pay

## 2022-03-13 NOTE — Telephone Encounter (Signed)
Processed via [email or web address] and I have received an approval  I called [facility] and they stated it would be ready for pt and a $0 co-pay  Message sent to pt

## 2022-04-08 ENCOUNTER — Encounter: Payer: Self-pay | Admitting: Oncology

## 2022-06-10 ENCOUNTER — Other Ambulatory Visit: Payer: Self-pay | Admitting: *Deleted

## 2022-06-10 DIAGNOSIS — D500 Iron deficiency anemia secondary to blood loss (chronic): Secondary | ICD-10-CM

## 2022-06-11 ENCOUNTER — Inpatient Hospital Stay: Payer: BC Managed Care – PPO | Attending: Oncology

## 2022-06-11 ENCOUNTER — Other Ambulatory Visit: Payer: BLUE CROSS/BLUE SHIELD

## 2022-06-11 ENCOUNTER — Ambulatory Visit: Payer: BLUE CROSS/BLUE SHIELD | Admitting: Oncology

## 2022-06-11 DIAGNOSIS — D500 Iron deficiency anemia secondary to blood loss (chronic): Secondary | ICD-10-CM

## 2022-06-11 DIAGNOSIS — D509 Iron deficiency anemia, unspecified: Secondary | ICD-10-CM | POA: Insufficient documentation

## 2022-06-11 DIAGNOSIS — Z87891 Personal history of nicotine dependence: Secondary | ICD-10-CM | POA: Diagnosis not present

## 2022-06-11 LAB — FERRITIN: Ferritin: 7 ng/mL — ABNORMAL LOW (ref 11–307)

## 2022-06-11 LAB — IRON AND TIBC
Iron: 24 ug/dL — ABNORMAL LOW (ref 28–170)
Saturation Ratios: 5 % — ABNORMAL LOW (ref 10.4–31.8)
TIBC: 473 ug/dL — ABNORMAL HIGH (ref 250–450)
UIBC: 449 ug/dL

## 2022-06-11 LAB — CBC WITH DIFFERENTIAL/PLATELET
Abs Immature Granulocytes: 0.01 10*3/uL (ref 0.00–0.07)
Basophils Absolute: 0 10*3/uL (ref 0.0–0.1)
Basophils Relative: 0 %
Eosinophils Absolute: 0.1 10*3/uL (ref 0.0–0.5)
Eosinophils Relative: 2 %
HCT: 30.8 % — ABNORMAL LOW (ref 36.0–46.0)
Hemoglobin: 9 g/dL — ABNORMAL LOW (ref 12.0–15.0)
Immature Granulocytes: 0 %
Lymphocytes Relative: 16 %
Lymphs Abs: 0.9 10*3/uL (ref 0.7–4.0)
MCH: 24.8 pg — ABNORMAL LOW (ref 26.0–34.0)
MCHC: 29.2 g/dL — ABNORMAL LOW (ref 30.0–36.0)
MCV: 84.8 fL (ref 80.0–100.0)
Monocytes Absolute: 0.5 10*3/uL (ref 0.1–1.0)
Monocytes Relative: 10 %
Neutro Abs: 3.9 10*3/uL (ref 1.7–7.7)
Neutrophils Relative %: 72 %
Platelets: 281 10*3/uL (ref 150–400)
RBC: 3.63 MIL/uL — ABNORMAL LOW (ref 3.87–5.11)
RDW: 18.9 % — ABNORMAL HIGH (ref 11.5–15.5)
WBC: 5.5 10*3/uL (ref 4.0–10.5)
nRBC: 0 % (ref 0.0–0.2)

## 2022-06-11 LAB — SAMPLE TO BLOOD BANK

## 2022-06-12 ENCOUNTER — Ambulatory Visit: Payer: BLUE CROSS/BLUE SHIELD

## 2022-06-12 ENCOUNTER — Encounter: Payer: Self-pay | Admitting: Oncology

## 2022-06-12 ENCOUNTER — Inpatient Hospital Stay: Payer: BC Managed Care – PPO

## 2022-06-12 ENCOUNTER — Inpatient Hospital Stay: Payer: BC Managed Care – PPO | Admitting: Oncology

## 2022-06-12 VITALS — BP 103/67 | HR 101 | Temp 99.8°F | Resp 16 | Ht 70.0 in | Wt 244.0 lb

## 2022-06-12 VITALS — BP 109/61 | HR 76

## 2022-06-12 DIAGNOSIS — D500 Iron deficiency anemia secondary to blood loss (chronic): Secondary | ICD-10-CM | POA: Diagnosis not present

## 2022-06-12 DIAGNOSIS — D509 Iron deficiency anemia, unspecified: Secondary | ICD-10-CM | POA: Diagnosis not present

## 2022-06-12 MED ORDER — SODIUM CHLORIDE 0.9 % IV SOLN
200.0000 mg | Freq: Once | INTRAVENOUS | Status: AC
  Administered 2022-06-12: 200 mg via INTRAVENOUS
  Filled 2022-06-12: qty 200

## 2022-06-12 MED ORDER — SODIUM CHLORIDE 0.9 % IV SOLN
Freq: Once | INTRAVENOUS | Status: AC
  Filled 2022-06-12: qty 250

## 2022-06-12 NOTE — Patient Instructions (Signed)

## 2022-06-12 NOTE — Progress Notes (Signed)
[place]  Telephone:(336) [phone] Fax:(336) [phone]  ID: Tina Hurst OB: [birth year]  MR#: [id number]  [id number]  Patient Care Team: [facility] as [facility]CP - General  CHIEF COMPLAINT: Iron deficiency anemia.  INTERVAL HISTORY: Patient returns to clinic today for repeat laboratory work, further evaluation, and consideration additional IV iron.  She continues to have weakness and fatigue, but admits this has improved since her recent infusions.  She otherwise feels well.  She has no neurologic complaints.  She denies any recent fevers or illnesses.  She has a good appetite and denies weight loss.  She has no chest pain, shortness of breath, cough, or hemoptysis.  She denies any nausea, vomiting, constipation, or diarrhea.  She has no melena or hematochezia.  She has no urinary complaints.  Patient offers no further specific complaints today.  REVIEW OF SYSTEMS:   Review of Systems  Constitutional:  Positive for malaise/fatigue. Negative for fever and weight loss.  Respiratory: Negative.  Negative for cough, hemoptysis and shortness of breath.   Cardiovascular: Negative.  Negative for chest pain and leg swelling.  Gastrointestinal: Negative.  Negative for abdominal pain, blood in stool and melena.  Genitourinary: Negative.  Negative for hematuria.  Musculoskeletal: Negative.  Negative for back pain.  Skin: Negative.  Negative for rash.  Neurological:  Positive for weakness. Negative for dizziness, focal weakness and headaches.  Psychiatric/Behavioral: Negative.  The patient is not nervous/anxious.     As per HPI. Otherwise, a complete review of systems is negative.  PAST MEDICAL HISTORY: Past Medical History:  Diagnosis Date   Diverticulosis    GERD (gastroesophageal reflux disease)    Hyperglycemia    Hypertension    IBS (irritable bowel syndrome)    Mitral valve prolapse    followed by PCP   Psoriasis    Wears contact lenses     PAST  SURGICAL HISTORY: Past Surgical History:  Procedure Laterality Date   CHOLECYSTECTOMY     COLONOSCOPY Left 02/10/2018   Procedure: COLONOSCOPY;  Surgeon: Virgel Manifold, MD;  Location: [facility] ENDOSCOPY;  Service: Endoscopy;  Laterality: Left;   COLONOSCOPY WITH PROPOFOL N/A 01/05/2015   Procedure: COLONOSCOPY WITH PROPOFOL;  Surgeon: Lucilla Lame, MD;  Location: [place];  Service: Endoscopy;  Laterality: N/A;   COLONOSCOPY WITH PROPOFOL N/A 02/03/2020   Procedure: COLONOSCOPY WITH BIOPSY;  Surgeon: Lucilla Lame, MD;  Location: [place];  Service: Endoscopy;  Laterality: N/A;   ESOPHAGOGASTRODUODENOSCOPY N/A 02/09/2018   Procedure: ESOPHAGOGASTRODUODENOSCOPY (EGD);  Surgeon: Virgel Manifold, MD;  Location: [facility] ENDOSCOPY;  Service: Endoscopy;  Laterality: N/A;   LAPAROSCOPIC TUBAL LIGATION     POLYPECTOMY  01/05/2015   Procedure: POLYPECTOMY;  Surgeon: Lucilla Lame, MD;  Location: [place];  Service: Endoscopy;;   POLYPECTOMY N/A 02/03/2020   Procedure: POLYPECTOMY;  Surgeon: Lucilla Lame, MD;  Location: [place];  Service: Endoscopy;  Laterality: N/A;    FAMILY HISTORY: Family History  Problem Relation Age of Onset   Irritable bowel syndrome Sister    Hypertension Mother    Diabetes Mother    Hypertension Father    Alcohol abuse Father    COPD Brother     ADVANCED DIRECTIVES (Y/N):  N  HEALTH MAINTENANCE: Social History   Tobacco Use   Smoking status: Former    Types: Cigarettes    Quit date: 2006    Years since quitting: 18.2   Smokeless tobacco: Never   Tobacco comments:  Vaping Use   Vaping Use: Never used  Substance Use Topics   Alcohol use: Yes    Alcohol/week: 3.0 standard drinks of alcohol    Types: 1 Glasses of wine, 1 Cans of beer, 1 Shots of liquor per week    Comment: occassional - 4 drinks a week   Drug use: No     Colonoscopy:  PAP:  Bone density:  Lipid panel:  Allergies  Allergen  Reactions   Augmentin [Amoxicillin-Pot Clavulanate] Nausea And Vomiting and Other (See Comments)    Pt states that she is able to take 250mg  tablet.     Amlodipine Swelling and Palpitations    Other Reaction: SWOLLEN FEET   Ginger Rash   Sulfa Antibiotics Rash    Current Outpatient Medications  Medication Sig Dispense Refill   carvedilol (COREG) 25 MG tablet Take 25 mg by mouth 2 (two) times daily.     cholecalciferol (VITAMIN D3) 25 MCG (1000 UNIT) tablet Take 1,000 Units by mouth daily.     ferrous gluconate (FERGON) 324 MG tablet Take 324 mg by mouth daily with breakfast.     hydrOXYzine (ATARAX) 25 MG tablet Take 25 mg by mouth 3 (three) times daily.     losartan-hydrochlorothiazide (HYZAAR) 100-12.5 MG tablet Take 1 tablet by mouth daily.     magnesium oxide (MAG-OX) 400 MG tablet Take 400 mg by mouth daily.     potassium chloride (MICRO-K) 10 MEQ CR capsule Take 10 mEq by mouth 2 (two) times daily. 2 tablets daily     traZODone (DESYREL) 50 MG tablet Take 50 mg by mouth at bedtime.     No current facility-administered medications for this visit.    OBJECTIVE: Vitals:   06/12/22 1411  BP: 103/67  Pulse: (!) 101  Resp: 16  Temp: 99.8 F (37.7 C)  SpO2: 98%     Body mass index is 35.01 kg/m.    ECOG FS:0 - Asymptomatic  General: Well-developed, well-nourished, no acute distress. Eyes: Pink conjunctiva, anicteric sclera. HEENT: Normocephalic, moist mucous membranes. Lungs: No audible wheezing or coughing. Heart: Regular rate and rhythm. Abdomen: Soft, nontender, no obvious distention. Musculoskeletal: No edema, cyanosis, or clubbing. Neuro: Alert, answering all questions appropriately. Cranial nerves grossly intact. Skin: No rashes or petechiae noted. Psych: Normal affect.  LAB RESULTS:  Lab Results  Component Value Date   NA 141 01/31/2022   K 3.1 (L) 01/31/2022   CL 103 01/31/2022   CO2 29 01/31/2022   GLUCOSE 159 (H) 01/31/2022   BUN 20 01/31/2022    CREATININE 0.93 01/31/2022   CALCIUM 8.6 (L) 01/31/2022   PROT 7.8 11/01/2014   ALBUMIN 4.5 11/01/2014   AST 51 (H) 11/01/2014   ALT 42 11/01/2014   ALKPHOS 106 11/01/2014   BILITOT 0.5 11/01/2014   GFRNONAA >60 01/31/2022   GFRAA >60 02/09/2018    Lab Results  Component Value Date   WBC 5.5 06/11/2022   NEUTROABS 3.9 06/11/2022   HGB 9.0 (L) 06/11/2022   HCT 30.8 (L) 06/11/2022   MCV 84.8 06/11/2022   PLT 281 06/11/2022   Lab Results  Component Value Date   IRON 24 (L) 06/11/2022   TIBC 473 (H) 06/11/2022   IRONPCTSAT 5 (L) 06/11/2022   Lab Results  Component Value Date   FERRITIN 7 (L) 06/11/2022     STUDIES: No results found.  ASSESSMENT: Iron deficiency anemia.  PLAN:    Iron deficiency anemia: Patient had full GI work-up 3 to 4 years ago  that did not reveal any distinct etiology.  Repeat colonoscopy November 2021 revealed 2 polyps, but no other significant pathology.  GI plans to give her rifaximin for her IBS but did not mention repeat colonoscopy.  Her hemoglobin improved to 9.0, but her iron stores remain significantly reduced.  Will give an additional 4 infusions of 200 mg IV Venofer over the next 2 weeks.  Patient will then return to clinic the first week of June prior to going on vacation for repeat laboratory work, further evaluation, and continuation of treatment if needed.  IBS: Rifaximin per GI.  Continue follow-up with them as scheduled.    I spent a total of 30 minutes reviewing chart data, face-to-face evaluation with the patient, counseling and coordination of care as detailed above.  Patient expressed understanding and was in agreement with this plan. She also understands that She can call clinic at any time with any questions, concerns, or complaints.     Lloyd Huger, MD   06/12/2022 2:33 PM

## 2022-06-13 ENCOUNTER — Encounter: Payer: Self-pay | Admitting: Oncology

## 2022-06-13 MED FILL — Iron Sucrose Inj 20 MG/ML (Fe Equiv): INTRAVENOUS | Qty: 10 | Status: AC

## 2022-06-16 ENCOUNTER — Inpatient Hospital Stay: Payer: BC Managed Care – PPO

## 2022-06-18 ENCOUNTER — Inpatient Hospital Stay: Payer: BC Managed Care – PPO | Attending: Oncology

## 2022-06-18 VITALS — BP 136/83 | HR 80 | Temp 98.4°F | Resp 17

## 2022-06-18 DIAGNOSIS — D509 Iron deficiency anemia, unspecified: Secondary | ICD-10-CM | POA: Diagnosis present

## 2022-06-18 DIAGNOSIS — D500 Iron deficiency anemia secondary to blood loss (chronic): Secondary | ICD-10-CM

## 2022-06-18 MED ORDER — SODIUM CHLORIDE 0.9% FLUSH
10.0000 mL | Freq: Once | INTRAVENOUS | Status: AC | PRN
  Administered 2022-06-18: 10 mL
  Filled 2022-06-18: qty 10

## 2022-06-18 MED ORDER — SODIUM CHLORIDE 0.9 % IV SOLN
Freq: Once | INTRAVENOUS | Status: AC
  Filled 2022-06-18: qty 250

## 2022-06-18 MED ORDER — SODIUM CHLORIDE 0.9 % IV SOLN
200.0000 mg | Freq: Once | INTRAVENOUS | Status: AC
  Administered 2022-06-18: 200 mg via INTRAVENOUS
  Filled 2022-06-18: qty 200

## 2022-06-18 NOTE — Patient Instructions (Signed)

## 2022-06-18 NOTE — Progress Notes (Signed)
Patient tolerated Venofer infusion well. Explained recommendation of 30 min post monitoring. Patient refused to wait post monitoring. Educated on what signs to watch for & to call with any concerns. No questions, discharged. Stable  

## 2022-06-19 MED FILL — Iron Sucrose Inj 20 MG/ML (Fe Equiv): INTRAVENOUS | Qty: 10 | Status: AC

## 2022-06-20 ENCOUNTER — Inpatient Hospital Stay: Payer: BC Managed Care – PPO

## 2022-06-20 VITALS — BP 117/68 | HR 69 | Temp 98.3°F | Resp 16

## 2022-06-20 DIAGNOSIS — D500 Iron deficiency anemia secondary to blood loss (chronic): Secondary | ICD-10-CM

## 2022-06-20 DIAGNOSIS — D509 Iron deficiency anemia, unspecified: Secondary | ICD-10-CM | POA: Diagnosis not present

## 2022-06-20 MED ORDER — SODIUM CHLORIDE 0.9 % IV SOLN
200.0000 mg | Freq: Once | INTRAVENOUS | Status: AC
  Administered 2022-06-20: 200 mg via INTRAVENOUS
  Filled 2022-06-20: qty 200

## 2022-06-20 MED ORDER — SODIUM CHLORIDE 0.9 % IV SOLN
INTRAVENOUS | Status: DC
  Filled 2022-06-20: qty 250

## 2022-06-20 NOTE — Patient Instructions (Signed)

## 2022-06-23 ENCOUNTER — Inpatient Hospital Stay: Payer: BC Managed Care – PPO

## 2022-06-23 VITALS — BP 130/69 | HR 69 | Temp 98.3°F | Resp 18

## 2022-06-23 DIAGNOSIS — D509 Iron deficiency anemia, unspecified: Secondary | ICD-10-CM | POA: Diagnosis not present

## 2022-06-23 DIAGNOSIS — D500 Iron deficiency anemia secondary to blood loss (chronic): Secondary | ICD-10-CM

## 2022-06-23 MED ORDER — SODIUM CHLORIDE 0.9 % IV SOLN
INTRAVENOUS | Status: DC | PRN
  Filled 2022-06-23: qty 250

## 2022-06-23 MED ORDER — SODIUM CHLORIDE 0.9 % IV SOLN
200.0000 mg | Freq: Once | INTRAVENOUS | Status: AC
  Administered 2022-06-23: 200 mg via INTRAVENOUS
  Filled 2022-06-23: qty 200

## 2022-06-26 MED FILL — Iron Sucrose Inj 20 MG/ML (Fe Equiv): INTRAVENOUS | Qty: 10 | Status: AC

## 2022-06-27 ENCOUNTER — Inpatient Hospital Stay: Payer: BC Managed Care – PPO

## 2022-06-27 VITALS — BP 109/63 | HR 61 | Temp 97.8°F | Resp 16

## 2022-06-27 DIAGNOSIS — D509 Iron deficiency anemia, unspecified: Secondary | ICD-10-CM | POA: Diagnosis not present

## 2022-06-27 DIAGNOSIS — D500 Iron deficiency anemia secondary to blood loss (chronic): Secondary | ICD-10-CM

## 2022-06-27 MED ORDER — SODIUM CHLORIDE 0.9 % IV SOLN
200.0000 mg | Freq: Once | INTRAVENOUS | Status: AC
  Administered 2022-06-27: 200 mg via INTRAVENOUS
  Filled 2022-06-27: qty 200

## 2022-06-27 MED ORDER — SODIUM CHLORIDE 0.9 % IV SOLN
INTRAVENOUS | Status: DC
  Filled 2022-06-27: qty 250

## 2022-06-27 NOTE — Patient Instructions (Signed)

## 2022-07-08 ENCOUNTER — Encounter: Payer: Self-pay | Admitting: Gastroenterology

## 2022-07-09 ENCOUNTER — Telehealth: Payer: Self-pay

## 2022-07-09 ENCOUNTER — Other Ambulatory Visit: Payer: Self-pay | Admitting: Gastroenterology

## 2022-07-09 MED ORDER — ALOSETRON HCL 0.5 MG PO TABS: 0.5000 mg | ORAL_TABLET | Freq: Two times a day (BID) | ORAL | 2 refills | Status: DC

## 2022-07-09 NOTE — Telephone Encounter (Signed)
PA has been submitted via [email or web address] for [facility] Rx  Awaiting response

## 2022-07-09 NOTE — Addendum Note (Signed)
Addended by: Littie Deeds Y on: 07/09/2022 02:00 PM   Modules accepted: Orders

## 2022-07-14 NOTE — Telephone Encounter (Signed)
Appeals faxed to [facility] [phone]

## 2022-07-14 NOTE — Telephone Encounter (Signed)
PA was denied...  Appeal sent, awaiting on response

## 2022-07-29 ENCOUNTER — Telehealth: Payer: Self-pay

## 2022-07-29 NOTE — Telephone Encounter (Signed)
Pt left message stating she would like you to call her back regarding the medication you are working on an authorization for. She has an update for you.

## 2022-07-30 NOTE — Telephone Encounter (Signed)
I have spoken to pt regarding this matter

## 2022-08-15 ENCOUNTER — Other Ambulatory Visit: Payer: Self-pay

## 2022-08-15 NOTE — Telephone Encounter (Signed)
Received a fax from [facility] stating that the appeal was received and is being processed

## 2022-08-18 ENCOUNTER — Inpatient Hospital Stay: Payer: BC Managed Care – PPO | Attending: Oncology

## 2022-08-18 DIAGNOSIS — Z87891 Personal history of nicotine dependence: Secondary | ICD-10-CM | POA: Diagnosis not present

## 2022-08-18 DIAGNOSIS — K589 Irritable bowel syndrome without diarrhea: Secondary | ICD-10-CM | POA: Diagnosis not present

## 2022-08-18 DIAGNOSIS — F1090 Alcohol use, unspecified, uncomplicated: Secondary | ICD-10-CM | POA: Diagnosis not present

## 2022-08-18 DIAGNOSIS — D500 Iron deficiency anemia secondary to blood loss (chronic): Secondary | ICD-10-CM

## 2022-08-18 DIAGNOSIS — D509 Iron deficiency anemia, unspecified: Secondary | ICD-10-CM | POA: Insufficient documentation

## 2022-08-18 LAB — CBC WITH DIFFERENTIAL/PLATELET
Abs Immature Granulocytes: 0.01 10*3/uL (ref 0.00–0.07)
Basophils Absolute: 0 10*3/uL (ref 0.0–0.1)
Basophils Relative: 1 %
Eosinophils Absolute: 0.3 10*3/uL (ref 0.0–0.5)
Eosinophils Relative: 6 %
HCT: 38.1 % (ref 36.0–46.0)
Hemoglobin: 12.3 g/dL (ref 12.0–15.0)
Immature Granulocytes: 0 %
Lymphocytes Relative: 16 %
Lymphs Abs: 0.9 10*3/uL (ref 0.7–4.0)
MCH: 32.6 pg (ref 26.0–34.0)
MCHC: 32.3 g/dL (ref 30.0–36.0)
MCV: 101.1 fL — ABNORMAL HIGH (ref 80.0–100.0)
Monocytes Absolute: 0.6 10*3/uL (ref 0.1–1.0)
Monocytes Relative: 10 %
Neutro Abs: 4 10*3/uL (ref 1.7–7.7)
Neutrophils Relative %: 67 %
Platelets: 206 10*3/uL (ref 150–400)
RBC: 3.77 MIL/uL — ABNORMAL LOW (ref 3.87–5.11)
RDW: 17.9 % — ABNORMAL HIGH (ref 11.5–15.5)
WBC: 5.9 10*3/uL (ref 4.0–10.5)
nRBC: 0 % (ref 0.0–0.2)

## 2022-08-18 LAB — FERRITIN: Ferritin: 44 ng/mL (ref 11–307)

## 2022-08-18 LAB — IRON AND TIBC
Iron: 49 ug/dL (ref 28–170)
Saturation Ratios: 11 % (ref 10.4–31.8)
TIBC: 430 ug/dL (ref 250–450)
UIBC: 381 ug/dL

## 2022-08-18 MED FILL — Iron Sucrose Inj 20 MG/ML (Fe Equiv): INTRAVENOUS | Qty: 10 | Status: AC

## 2022-08-19 ENCOUNTER — Inpatient Hospital Stay: Payer: BC Managed Care – PPO

## 2022-08-19 ENCOUNTER — Inpatient Hospital Stay: Payer: BC Managed Care – PPO | Admitting: Oncology

## 2022-08-19 ENCOUNTER — Encounter: Payer: Self-pay | Admitting: Oncology

## 2022-08-19 VITALS — BP 114/68 | HR 80 | Temp 97.6°F | Resp 18 | Ht 70.0 in | Wt 240.0 lb

## 2022-08-19 DIAGNOSIS — D509 Iron deficiency anemia, unspecified: Secondary | ICD-10-CM | POA: Diagnosis not present

## 2022-08-19 DIAGNOSIS — D500 Iron deficiency anemia secondary to blood loss (chronic): Secondary | ICD-10-CM | POA: Diagnosis not present

## 2022-08-19 NOTE — Progress Notes (Signed)
[facility]  Telephone:(336) [phone] Fax:(336) [phone]  ID: Tina Hurst OB: 02/16/1961  MR#: [id number]  AOZ#:[id number]  Patient Care Team: [facility] as [facility]CP - General  CHIEF COMPLAINT: Iron deficiency anemia.  INTERVAL HISTORY: Patient returns to clinic today for repeat laboratory work and further evaluation.  She does not complain of any weakness or fatigue today.  She currently feels well.  She has no neurologic complaints.  She denies any recent fevers or illnesses.  She has a good appetite and denies weight loss.  She has no chest pain, shortness of breath, cough, or hemoptysis.  She denies any nausea, vomiting, constipation, or diarrhea.  She has no melena or hematochezia.  She has no urinary complaints.  Patient offers no specific complaints today.  REVIEW OF SYSTEMS:   Review of Systems  Constitutional: Negative.  Negative for fever, malaise/fatigue and weight loss.  Respiratory: Negative.  Negative for cough, hemoptysis and shortness of breath.   Cardiovascular: Negative.  Negative for chest pain and leg swelling.  Gastrointestinal: Negative.  Negative for abdominal pain, blood in stool and melena.  Genitourinary: Negative.  Negative for hematuria.  Musculoskeletal: Negative.  Negative for back pain.  Skin: Negative.  Negative for rash.  Neurological: Negative.  Negative for dizziness, focal weakness, weakness and headaches.  Psychiatric/Behavioral: Negative.  The patient is not nervous/anxious.     As per HPI. Otherwise, a complete review of systems is negative.  PAST MEDICAL HISTORY: Past Medical History:  Diagnosis Date   Diverticulosis    GERD (gastroesophageal reflux disease)    Hyperglycemia    Hypertension    IBS (irritable bowel syndrome)    Mitral valve prolapse    followed by PCP   Psoriasis    Wears contact lenses     PAST SURGICAL HISTORY: Past Surgical History:  Procedure Laterality Date   CHOLECYSTECTOMY      COLONOSCOPY Left 02/10/2018   Procedure: COLONOSCOPY;  Surgeon: Pasty Spillers, MD;  Location: [facility] ENDOSCOPY;  Service: Endoscopy;  Laterality: Left;   COLONOSCOPY WITH PROPOFOL N/A 01/05/2015   Procedure: COLONOSCOPY WITH PROPOFOL;  Surgeon: Midge Minium, MD;  Location: [facility];  Service: Endoscopy;  Laterality: N/A;   COLONOSCOPY WITH PROPOFOL N/A 02/03/2020   Procedure: COLONOSCOPY WITH BIOPSY;  Surgeon: Midge Minium, MD;  Location: [facility];  Service: Endoscopy;  Laterality: N/A;   ESOPHAGOGASTRODUODENOSCOPY N/A 02/09/2018   Procedure: ESOPHAGOGASTRODUODENOSCOPY (EGD);  Surgeon: Pasty Spillers, MD;  Location: [facility] ENDOSCOPY;  Service: Endoscopy;  Laterality: N/A;   LAPAROSCOPIC TUBAL LIGATION     POLYPECTOMY  01/05/2015   Procedure: POLYPECTOMY;  Surgeon: Midge Minium, MD;  Location: [facility];  Service: Endoscopy;;   POLYPECTOMY N/A 02/03/2020   Procedure: POLYPECTOMY;  Surgeon: Midge Minium, MD;  Location: [facility]ERY [facility];  Service: Endoscopy;  Laterality: N/A;    FAMILY HISTORY: Family History  Problem Relation Age of Onset   Irritable bowel syndrome Sister    Hypertension Mother    Diabetes Mother    Hypertension Father    Alcohol abuse Father    COPD Brother     ADVANCED DIRECTIVES (Y/N):  N  HEALTH MAINTENANCE: Social History   Tobacco Use   Smoking status: Former    Types: Cigarettes    Quit date: 2006    Years since quitting: 18.4   Smokeless tobacco: Never   Tobacco comments:       Vaping Use   Vaping Use: Never used  Substance Use  Topics   Alcohol use: Yes    Alcohol/week: 3.0 standard drinks of alcohol    Types: 1 Glasses of wine, 1 Cans of beer, 1 Shots of liquor per week    Comment: occassional - 4 drinks a week   Drug use: No     Colonoscopy:  PAP:  Bone density:  Lipid panel:  Allergies  Allergen Reactions   Augmentin [Amoxicillin-Pot Clavulanate] Nausea And Vomiting and Other (See Comments)     Pt states that she is able to take 250mg  tablet.     Amlodipine Swelling and Palpitations    Other Reaction: SWOLLEN FEET   Ginger Rash   Sulfa Antibiotics Rash    Current Outpatient Medications  Medication Sig Dispense Refill   carvedilol (COREG) 25 MG tablet Take 25 mg by mouth 2 (two) times daily.     cholecalciferol (VITAMIN D3) 25 MCG (1000 UNIT) tablet Take 1,000 Units by mouth daily.     DUPIXENT 300 MG/2ML SOPN Inject 300 mg into the skin every 14 (fourteen) days.     losartan-hydrochlorothiazide (HYZAAR) 100-12.5 MG tablet Take 1 tablet by mouth daily.     magnesium oxide (MAG-OX) 400 MG tablet Take 400 mg by mouth daily.     OVER THE COUNTER MEDICATION Heme Plus     potassium chloride (MICRO-K) 10 MEQ CR capsule Take 10 mEq by mouth 2 (two) times daily. 2 tablets daily     ferrous gluconate (FERGON) 324 MG tablet Take 324 mg by mouth daily with breakfast. (Patient not taking: Reported on 08/19/2022)     hydrOXYzine (ATARAX) 25 MG tablet Take 25 mg by mouth 3 (three) times daily. (Patient not taking: Reported on 08/19/2022)     No current facility-administered medications for this visit.    OBJECTIVE: Vitals:   08/19/22 1306  BP: 114/68  Pulse: 80  Resp: 18  Temp: 97.6 F (36.4 C)  SpO2: 99%     Body mass index is 34.44 kg/m.    ECOG FS:0 - Asymptomatic  General: Well-developed, well-nourished, no acute distress. Eyes: Pink conjunctiva, anicteric sclera. HEENT: Normocephalic, moist mucous membranes. Lungs: No audible wheezing or coughing. Heart: Regular rate and rhythm. Abdomen: Soft, nontender, no obvious distention. Musculoskeletal: No edema, cyanosis, or clubbing. Neuro: Alert, answering all questions appropriately. Cranial nerves grossly intact. Skin: No rashes or petechiae noted. Psych: Normal affect.  LAB RESULTS:  Lab Results  Component Value Date   NA 141 01/31/2022   K 3.1 (L) 01/31/2022   CL 103 01/31/2022   CO2 29 01/31/2022   GLUCOSE 159 (H)  01/31/2022   BUN 20 01/31/2022   CREATININE 0.93 01/31/2022   CALCIUM 8.6 (L) 01/31/2022   PROT 7.8 11/01/2014   ALBUMIN 4.5 11/01/2014   AST 51 (H) 11/01/2014   ALT 42 11/01/2014   ALKPHOS 106 11/01/2014   BILITOT 0.5 11/01/2014   GFRNONAA >60 01/31/2022   GFRAA >60 02/09/2018    Lab Results  Component Value Date   WBC 5.9 08/18/2022   NEUTROABS 4.0 08/18/2022   HGB 12.3 08/18/2022   HCT 38.1 08/18/2022   MCV 101.1 (H) 08/18/2022   PLT 206 08/18/2022   Lab Results  Component Value Date   IRON 49 08/18/2022   TIBC 430 08/18/2022   IRONPCTSAT 11 08/18/2022   Lab Results  Component Value Date   FERRITIN 44 08/18/2022     STUDIES: No results found.  ASSESSMENT: Iron deficiency anemia.  PLAN:    Iron deficiency anemia: Patient had full  GI work-up over 4 years ago that did not reveal any distinct etiology.  Repeat colonoscopy November 2021 revealed 2 polyps, but no other significant pathology.  Hemoglobin and iron stores are now within normal limits.  Patient does not require additional treatment today.  She last received IV Venofer on June 27, 2022.  Return to clinic in 4 months prior to her next cruise to [place], for repeat laboratory work, further evaluation, and additional treatment if necessary.   IBS: Rifaximin per GI.  Continue follow-up with them as scheduled.    I spent a total of 20 minutes reviewing chart data, face-to-face evaluation with the patient, counseling and coordination of care as detailed above.   Patient expressed understanding and was in agreement with this plan. She also understands that She can call clinic at any time with any questions, concerns, or complaints.     Jeralyn Ruths, MD   08/19/2022 1:19 PM

## 2022-11-18 ENCOUNTER — Other Ambulatory Visit: Payer: Self-pay | Admitting: *Deleted

## 2022-11-18 DIAGNOSIS — D500 Iron deficiency anemia secondary to blood loss (chronic): Secondary | ICD-10-CM

## 2022-11-19 ENCOUNTER — Inpatient Hospital Stay: Payer: BC Managed Care – PPO | Attending: Oncology

## 2022-11-19 DIAGNOSIS — D500 Iron deficiency anemia secondary to blood loss (chronic): Secondary | ICD-10-CM

## 2022-11-19 DIAGNOSIS — D509 Iron deficiency anemia, unspecified: Secondary | ICD-10-CM | POA: Insufficient documentation

## 2022-11-19 LAB — CBC WITH DIFFERENTIAL/PLATELET
Abs Immature Granulocytes: 0.01 10*3/uL (ref 0.00–0.07)
Basophils Absolute: 0 10*3/uL (ref 0.0–0.1)
Basophils Relative: 1 %
Eosinophils Absolute: 0.1 10*3/uL (ref 0.0–0.5)
Eosinophils Relative: 3 %
HCT: 38 % (ref 36.0–46.0)
Hemoglobin: 12.4 g/dL (ref 12.0–15.0)
Immature Granulocytes: 0 %
Lymphocytes Relative: 16 %
Lymphs Abs: 0.8 10*3/uL (ref 0.7–4.0)
MCH: 34.8 pg — ABNORMAL HIGH (ref 26.0–34.0)
MCHC: 32.6 g/dL (ref 30.0–36.0)
MCV: 106.7 fL — ABNORMAL HIGH (ref 80.0–100.0)
Monocytes Absolute: 0.5 10*3/uL (ref 0.1–1.0)
Monocytes Relative: 10 %
Neutro Abs: 3.3 10*3/uL (ref 1.7–7.7)
Neutrophils Relative %: 70 %
Platelets: 170 10*3/uL (ref 150–400)
RBC: 3.56 MIL/uL — ABNORMAL LOW (ref 3.87–5.11)
RDW: 12.9 % (ref 11.5–15.5)
WBC: 4.7 10*3/uL (ref 4.0–10.5)
nRBC: 0 % (ref 0.0–0.2)

## 2022-11-19 LAB — IRON AND TIBC
Iron: 109 ug/dL (ref 28–170)
Saturation Ratios: 25 % (ref 10.4–31.8)
TIBC: 444 ug/dL (ref 250–450)
UIBC: 335 ug/dL

## 2022-11-19 LAB — FERRITIN: Ferritin: 17 ng/mL (ref 11–307)

## 2022-11-20 ENCOUNTER — Inpatient Hospital Stay: Payer: BC Managed Care – PPO | Admitting: Oncology

## 2022-11-20 ENCOUNTER — Inpatient Hospital Stay: Payer: BC Managed Care – PPO

## 2022-11-20 VITALS — BP 124/80 | HR 70 | Temp 97.7°F | Wt 245.9 lb

## 2022-11-20 DIAGNOSIS — D500 Iron deficiency anemia secondary to blood loss (chronic): Secondary | ICD-10-CM | POA: Diagnosis not present

## 2022-11-20 DIAGNOSIS — D509 Iron deficiency anemia, unspecified: Secondary | ICD-10-CM | POA: Diagnosis not present

## 2022-11-20 NOTE — Progress Notes (Signed)
No Venofer today per MD 

## 2022-11-20 NOTE — Progress Notes (Signed)
[facility]  Telephone:(336) [phone] Fax:(336) [phone]  ID: Tina Hurst OB: 22-Nov-1960  MR#: [id number]  GUY#:[id number]  Patient Care Team: [facility] as [facility]CP - General  CHIEF COMPLAINT: Iron deficiency anemia.  INTERVAL HISTORY: Patient returns to clinic today for repeat laboratory work and further evaluation.  She continues to have issues with her IBS, but otherwise feels well.  She does not complain of weakness or fatigue today.  She is going on a cruise to [place] in 2 weeks. She has no neurologic complaints.  She denies any recent fevers or illnesses.  She has a good appetite and denies weight loss.  She has no chest pain, shortness of breath, cough, or hemoptysis.  She denies any nausea, vomiting, constipation, or diarrhea.  She has no melena or hematochezia.  She has no urinary complaints.  Patient offers no further specific complaints today.  REVIEW OF SYSTEMS:   Review of Systems  Constitutional: Negative.  Negative for fever, malaise/fatigue and weight loss.  Respiratory: Negative.  Negative for cough, hemoptysis and shortness of breath.   Cardiovascular: Negative.  Negative for chest pain and leg swelling.  Gastrointestinal: Negative.  Negative for abdominal pain, blood in stool and melena.  Genitourinary: Negative.  Negative for hematuria.  Musculoskeletal: Negative.  Negative for back pain.  Skin: Negative.  Negative for rash.  Neurological: Negative.  Negative for dizziness, focal weakness, weakness and headaches.  Psychiatric/Behavioral: Negative.  The patient is not nervous/anxious.     As per HPI. Otherwise, a complete review of systems is negative.  PAST MEDICAL HISTORY: Past Medical History:  Diagnosis Date   Diverticulosis    GERD (gastroesophageal reflux disease)    Hyperglycemia    Hypertension    IBS (irritable bowel syndrome)    Mitral valve prolapse    followed by PCP   Psoriasis    Wears contact lenses      PAST SURGICAL HISTORY: Past Surgical History:  Procedure Laterality Date   CHOLECYSTECTOMY     COLONOSCOPY Left 02/10/2018   Procedure: COLONOSCOPY;  Surgeon: Pasty Spillers, MD;  Location: [facility] ENDOSCOPY;  Service: Endoscopy;  Laterality: Left;   COLONOSCOPY WITH PROPOFOL N/A 01/05/2015   Procedure: COLONOSCOPY WITH PROPOFOL;  Surgeon: Midge Minium, MD;  Location: [facility];  Service: Endoscopy;  Laterality: N/A;   COLONOSCOPY WITH PROPOFOL N/A 02/03/2020   Procedure: COLONOSCOPY WITH BIOPSY;  Surgeon: Midge Minium, MD;  Location: [facility];  Service: Endoscopy;  Laterality: N/A;   ESOPHAGOGASTRODUODENOSCOPY N/A 02/09/2018   Procedure: ESOPHAGOGASTRODUODENOSCOPY (EGD);  Surgeon: Pasty Spillers, MD;  Location: [facility] ENDOSCOPY;  Service: Endoscopy;  Laterality: N/A;   LAPAROSCOPIC TUBAL LIGATION     POLYPECTOMY  01/05/2015   Procedure: POLYPECTOMY;  Surgeon: Midge Minium, MD;  Location: [facility];  Service: Endoscopy;;   POLYPECTOMY N/A 02/03/2020   Procedure: POLYPECTOMY;  Surgeon: Midge Minium, MD;  Location: [facility];  Service: Endoscopy;  Laterality: N/A;    FAMILY HISTORY: Family History  Problem Relation Age of Onset   Irritable bowel syndrome Sister    Hypertension Mother    Diabetes Mother    Hypertension Father    Alcohol abuse Father    COPD Brother     ADVANCED DIRECTIVES (Y/N):  N  HEALTH MAINTENANCE: Social History   Tobacco Use   Smoking status: Former    Current packs/day: 0.00    Types: Cigarettes    Quit date: 2006    Years since quitting: 18.6  Smokeless tobacco: Never   Tobacco comments:       Vaping Use   Vaping status: Never Used  Substance Use Topics   Alcohol use: Yes    Alcohol/week: 3.0 standard drinks of alcohol    Types: 1 Glasses of wine, 1 Cans of beer, 1 Shots of liquor per week    Comment: occassional - 4 drinks a week   Drug use: No     Colonoscopy:  PAP:  Bone  density:  Lipid panel:  Allergies  Allergen Reactions   Augmentin [Amoxicillin-Pot Clavulanate] Nausea And Vomiting and Other (See Comments)    Pt states that she is able to take 250mg  tablet.     Amlodipine Swelling and Palpitations    Other Reaction: SWOLLEN FEET   Ginger Rash   Sulfa Antibiotics Rash    Current Outpatient Medications  Medication Sig Dispense Refill   carvedilol (COREG) 25 MG tablet Take 25 mg by mouth 2 (two) times daily.     cholecalciferol (VITAMIN D3) 25 MCG (1000 UNIT) tablet Take 1,000 Units by mouth daily.     DUPIXENT 300 MG/2ML SOPN Inject 300 mg into the skin every 14 (fourteen) days.     losartan-hydrochlorothiazide (HYZAAR) 100-12.5 MG tablet Take 1 tablet by mouth daily.     magnesium oxide (MAG-OX) 400 MG tablet Take 400 mg by mouth daily.     OVER THE COUNTER MEDICATION Heme Plus     potassium chloride (MICRO-K) 10 MEQ CR capsule Take 10 mEq by mouth 2 (two) times daily. 2 tablets daily     ferrous gluconate (FERGON) 324 MG tablet Take 324 mg by mouth daily with breakfast. (Patient not taking: Reported on 08/19/2022)     hydrOXYzine (ATARAX) 25 MG tablet Take 25 mg by mouth 3 (three) times daily. (Patient not taking: Reported on 08/19/2022)     No current facility-administered medications for this visit.    OBJECTIVE: Vitals:   11/20/22 1421  BP: 124/80  Pulse: 70  Temp: 97.7 F (36.5 C)  SpO2: 100%      Body mass index is 35.28 kg/m.    ECOG FS:0 - Asymptomatic  General: Well-developed, well-nourished, no acute distress. Eyes: Pink conjunctiva, anicteric sclera. HEENT: Normocephalic, moist mucous membranes. Lungs: No audible wheezing or coughing. Heart: Regular rate and rhythm. Abdomen: Soft, nontender, no obvious distention. Musculoskeletal: No edema, cyanosis, or clubbing. Neuro: Alert, answering all questions appropriately. Cranial nerves grossly intact. Skin: No rashes or petechiae noted. Psych: Normal affect.  LAB RESULTS:  Lab  Results  Component Value Date   NA 141 01/31/2022   K 3.1 (L) 01/31/2022   CL 103 01/31/2022   CO2 29 01/31/2022   GLUCOSE 159 (H) 01/31/2022   BUN 20 01/31/2022   CREATININE 0.93 01/31/2022   CALCIUM 8.6 (L) 01/31/2022   PROT 7.8 11/01/2014   ALBUMIN 4.5 11/01/2014   AST 51 (H) 11/01/2014   ALT 42 11/01/2014   ALKPHOS 106 11/01/2014   BILITOT 0.5 11/01/2014   GFRNONAA >60 01/31/2022   GFRAA >60 02/09/2018    Lab Results  Component Value Date   WBC 4.7 11/19/2022   NEUTROABS 3.3 11/19/2022   HGB 12.4 11/19/2022   HCT 38.0 11/19/2022   MCV 106.7 (H) 11/19/2022   PLT 170 11/19/2022   Lab Results  Component Value Date   IRON 109 11/19/2022   TIBC 444 11/19/2022   IRONPCTSAT 25 11/19/2022   Lab Results  Component Value Date   FERRITIN 17 11/19/2022  STUDIES: No results found.  ASSESSMENT: Iron deficiency anemia.  PLAN:    Iron deficiency anemia: Patient had full GI work-up over 4 years ago that did not reveal any distinct etiology.  Repeat colonoscopy November 2021 revealed 2 polyps, but no other significant pathology.  Her hemoglobin and iron stores continue to be within normal limits.  She does not require additional IV Venofer today.  She last received treatment on June 27, 2022.  Return to clinic in 6 months for repeat laboratory work and further evaluation. IBS: Continue follow-up with GI as needed.  I spent a total of 20 minutes reviewing chart data, face-to-face evaluation with the patient, counseling and coordination of care as detailed above.    Patient expressed understanding and was in agreement with this plan. She also understands that She can call clinic at any time with any questions, concerns, or complaints.     Jeralyn Ruths, MD   11/20/2022 2:48 PM

## 2022-11-20 NOTE — Progress Notes (Signed)
Patient says that her iron is improving, she wants to know if there is anything else she could be taking to help raise everything else, like taking vitamin B-12. Her fatigue/energy has improved a little bit but isn't where she would like it to be. IBS is still an issue for her and her insurance refuses to help cover the medication she needs. She wants to know if her iron levels are up will Dr. Irving Copas want her to still have iron infusion. She is also eating a little differently to try to help with her energy.

## 2022-11-23 ENCOUNTER — Encounter: Payer: Self-pay | Admitting: Gastroenterology

## 2022-11-23 DIAGNOSIS — R195 Other fecal abnormalities: Secondary | ICD-10-CM

## 2022-11-24 NOTE — Addendum Note (Signed)
Addended by: Roena Malady on: 11/24/2022 09:48 AM   Modules accepted: Orders

## 2022-11-24 NOTE — Addendum Note (Signed)
Addended by: Roena Malady on: 11/24/2022 01:30 PM   Modules accepted: Orders

## 2022-11-25 LAB — CBC WITH DIFFERENTIAL/PLATELET
Basophils Absolute: 0.1 10*3/uL (ref 0.0–0.2)
Basos: 1 %
EOS (ABSOLUTE): 0.2 10*3/uL (ref 0.0–0.4)
Eos: 3 %
Hematocrit: 39.1 % (ref 34.0–46.6)
Hemoglobin: 13.1 g/dL (ref 11.1–15.9)
Immature Grans (Abs): 0 10*3/uL (ref 0.0–0.1)
Immature Granulocytes: 0 %
Lymphocytes Absolute: 0.8 10*3/uL (ref 0.7–3.1)
Lymphs: 15 %
MCH: 35.4 pg — ABNORMAL HIGH (ref 26.6–33.0)
MCHC: 33.5 g/dL (ref 31.5–35.7)
MCV: 106 fL — ABNORMAL HIGH (ref 79–97)
Monocytes Absolute: 0.5 10*3/uL (ref 0.1–0.9)
Monocytes: 9 %
Neutrophils Absolute: 3.8 10*3/uL (ref 1.4–7.0)
Neutrophils: 72 %
Platelets: 232 10*3/uL (ref 150–450)
RBC: 3.7 x10E6/uL — ABNORMAL LOW (ref 3.77–5.28)
RDW: 12.2 % (ref 11.7–15.4)
WBC: 5.2 10*3/uL (ref 3.4–10.8)

## 2022-12-20 ENCOUNTER — Encounter (HOSPITAL_BASED_OUTPATIENT_CLINIC_OR_DEPARTMENT_OTHER): Payer: Self-pay

## 2022-12-20 ENCOUNTER — Emergency Department (HOSPITAL_BASED_OUTPATIENT_CLINIC_OR_DEPARTMENT_OTHER): Payer: BC Managed Care – PPO

## 2022-12-20 ENCOUNTER — Emergency Department (HOSPITAL_BASED_OUTPATIENT_CLINIC_OR_DEPARTMENT_OTHER): Payer: BC Managed Care – PPO | Admitting: Radiology

## 2022-12-20 ENCOUNTER — Emergency Department (HOSPITAL_BASED_OUTPATIENT_CLINIC_OR_DEPARTMENT_OTHER)
Admission: EM | Admit: 2022-12-20 | Discharge: 2022-12-20 | Disposition: A | Discharge status: Home / Self Care | Payer: BC Managed Care – PPO

## 2022-12-20 ENCOUNTER — Ambulatory Visit
Admission: RE | Admit: 2022-12-20 | Discharge: 2022-12-20 | Disposition: A | Discharge status: Home / Self Care | Payer: BC Managed Care – PPO | Source: Ambulatory Visit | Attending: Family Medicine | Admitting: Family Medicine

## 2022-12-20 VITALS — BP 156/85 | HR 68 | Temp 98.4°F | Resp 16 | Ht 70.0 in | Wt 243.0 lb

## 2022-12-20 DIAGNOSIS — L02216 Cutaneous abscess of umbilicus: Secondary | ICD-10-CM | POA: Diagnosis not present

## 2022-12-20 DIAGNOSIS — Z79899 Other long term (current) drug therapy: Secondary | ICD-10-CM | POA: Insufficient documentation

## 2022-12-20 DIAGNOSIS — I10 Essential (primary) hypertension: Secondary | ICD-10-CM | POA: Insufficient documentation

## 2022-12-20 DIAGNOSIS — L03316 Cellulitis of umbilicus: Secondary | ICD-10-CM | POA: Diagnosis present

## 2022-12-20 DIAGNOSIS — R1033 Periumbilical pain: Secondary | ICD-10-CM

## 2022-12-20 LAB — CBC WITH DIFFERENTIAL/PLATELET
Abs Immature Granulocytes: 0.01 10*3/uL (ref 0.00–0.07)
Basophils Absolute: 0 10*3/uL (ref 0.0–0.1)
Basophils Relative: 0 %
Eosinophils Absolute: 0 10*3/uL (ref 0.0–0.5)
Eosinophils Relative: 1 %
HCT: 39.6 % (ref 36.0–46.0)
Hemoglobin: 13.6 g/dL (ref 12.0–15.0)
Immature Granulocytes: 0 %
Lymphocytes Relative: 17 %
Lymphs Abs: 0.9 10*3/uL (ref 0.7–4.0)
MCH: 35.4 pg — ABNORMAL HIGH (ref 26.0–34.0)
MCHC: 34.3 g/dL (ref 30.0–36.0)
MCV: 103.1 fL — ABNORMAL HIGH (ref 80.0–100.0)
Monocytes Absolute: 0.4 10*3/uL (ref 0.1–1.0)
Monocytes Relative: 7 %
Neutro Abs: 4 10*3/uL (ref 1.7–7.7)
Neutrophils Relative %: 75 %
Platelets: 191 10*3/uL (ref 150–400)
RBC: 3.84 MIL/uL — ABNORMAL LOW (ref 3.87–5.11)
RDW: 12.7 % (ref 11.5–15.5)
WBC: 5.4 10*3/uL (ref 4.0–10.5)
nRBC: 0 % (ref 0.0–0.2)

## 2022-12-20 LAB — BASIC METABOLIC PANEL
Anion gap: 12 (ref 5–15)
BUN: 12 mg/dL (ref 8–23)
CO2: 24 mmol/L (ref 22–32)
Calcium: 9.1 mg/dL (ref 8.9–10.3)
Chloride: 101 mmol/L (ref 98–111)
Creatinine, Ser: 0.79 mg/dL (ref 0.44–1.00)
GFR, Estimated: >60 mL/min (ref >60)
Glucose, Bld: 148 mg/dL — ABNORMAL HIGH (ref 70–99)
Potassium: 3.6 mmol/L (ref 3.5–5.1)
Sodium: 137 mmol/L (ref 135–145)

## 2022-12-20 MED ORDER — IOHEXOL 300 MG/ML  SOLN
100.0000 mL | Freq: Once | INTRAMUSCULAR | Status: AC | PRN
  Administered 2022-12-20: 100 mL via INTRAVENOUS

## 2022-12-20 MED ORDER — OXYCODONE-ACETAMINOPHEN 5-325 MG PO TABS
1.0000 | ORAL_TABLET | Freq: Once | ORAL | Status: AC
  Administered 2022-12-20: 1 via ORAL
  Filled 2022-12-20: qty 1

## 2022-12-20 MED ORDER — CEPHALEXIN 500 MG PO CAPS: 500.0000 mg | ORAL_CAPSULE | Freq: Two times a day (BID) | ORAL | 0 refills | Status: DC

## 2022-12-20 MED ORDER — CEPHALEXIN 250 MG PO CAPS
500.0000 mg | ORAL_CAPSULE | Freq: Once | ORAL | Status: AC
  Administered 2022-12-20: 500 mg via ORAL
  Filled 2022-12-20: qty 2

## 2022-12-20 MED ORDER — OXYCODONE-ACETAMINOPHEN 5-325 MG PO TABS
1.0000 | ORAL_TABLET | Freq: Four times a day (QID) | ORAL | 0 refills | Status: DC | PRN
Start: 2022-12-20 — End: 2023-06-03

## 2022-12-20 NOTE — ED Provider Notes (Signed)
Patient seen with Mellody Dance, [place].  In summation 62 year old here for evaluation periumbilical abscess.  Noted few days ago.  Seen by urgent care and started antibiotics.  Started spontaneously draining today.  She had significant pain was seen again at urgent care and they recommended coming here for further imaging.  Bedside ultrasound shows possible very minimal fluid collection.  She is spontaneous drainage.  No fluctuance.  When her pain will obtain CT scan  CT scan abdomen pelvis personally viewed interpreted does not show any drainable fluid collection.  Patient without leukocytosis.  She is on 2 days of doxycycline.  Will add on Keflex.  Short course of pain management written.  Will have her follow-up outpatient.  Discussed warm compress.  Given no drainable collection currently do not feel she needs additional bedside drainage or packing.  However follow-up closely outpatient   Eber Ferrufino A, PA-C 12/20/22 2146    Terald Sleeper, MD 12/20/22 2326

## 2022-12-20 NOTE — ED Triage Notes (Signed)
Pt c/o cellulitis of umbilicus x1 wk. Was seen by PCP on 10/2 & was given doxy w/o relief. States area still red,painful & draining.

## 2022-12-20 NOTE — ED Notes (Signed)
Patient is being discharged from the Urgent Care and sent to the Emergency Department via private vehicle . Per Dr. Rachael Darby, patient is in need of higher level of care due to abdominal abscess. Patient is aware and verbalizes understanding of plan of care.  Vitals:   12/20/22 1234  BP: (!) 156/85  Pulse: 68  Resp: 16  Temp: 98.4 F (36.9 C)  SpO2: 99%

## 2022-12-20 NOTE — ED Triage Notes (Signed)
She c/o umbilicus "irritation" since this Monday. She states she saw her pcp, who rx with doxy. A couple of days ago. She is here today with c/o that this area "burst open and now it's draining stuff" she denies fever, and is in no distress. Her husband is with her.

## 2022-12-20 NOTE — ED Provider Notes (Signed)
[place] EMERGENCY DEPARTMENT AT [facility] Provider Note   CSN: [id number] Arrival date & time: 12/20/22  1357     History  Chief Complaint  Patient presents with   Abscess    Tina Hurst is a 62 y.o. female past medical history significant for hypertension, IBS, anemia, influenza A infection (diagnosed 12/16/2022) presents today for umbilical wound that started as a red streak on Monday.  She saw her PCP on Tuesday who started her on a 7-day course of doxycycline.  This morning she noted "a lot of pus and blood drainage".  She notes redness that has not progressed since starting antibiotics and that the area is very tender to touch.  She denies fever.  She endorses a productive cough since Tuesday.   Abscess Associated symptoms: no fever, no nausea and no vomiting        Home Medications Prior to Admission medications   Medication Sig Start Date End Date Taking? Authorizing Provider  cephALEXin (KEFLEX) 500 MG capsule Take 1 capsule (500 mg total) by mouth 2 (two) times daily. 12/20/22  Yes Dolphus Jenny, PA-C  oxyCODONE-acetaminophen (PERCOCET/ROXICET) 5-325 MG tablet Take 1 tablet by mouth every 6 (six) hours as needed for severe pain. 12/20/22  Yes Henderly, Britni A, PA-C  albuterol (VENTOLIN HFA) 108 (90 Base) MCG/ACT inhaler Inhale into the lungs. 09/12/20 12/17/23  [provider]  carvedilol (COREG) 25 MG tablet Take 25 mg by mouth 2 (two) times daily.    [provider]  cholecalciferol (VITAMIN D3) 25 MCG (1000 UNIT) tablet Take 1,000 Units by mouth daily.    [provider]  doxycycline (VIBRAMYCIN) 100 MG capsule Take 1 capsule by mouth 2 (two) times daily. 12/17/22 12/24/22  [provider]  DUPIXENT 300 MG/2ML SOPN Inject 300 mg into the skin every 14 (fourteen) days.    [provider]  ferrous gluconate (FERGON) 324 MG tablet Take 324 mg by mouth daily with breakfast. Patient not taking: Reported on 08/19/2022  02/05/22 02/05/23  [provider]  hydrOXYzine (ATARAX) 25 MG tablet Take 25 mg by mouth 3 (three) times daily. Patient not taking: Reported on 08/19/2022 05/28/22   [provider]  losartan-hydrochlorothiazide (HYZAAR) 100-12.5 MG tablet Take 1 tablet by mouth daily.    [provider]  magnesium oxide (MAG-OX) 400 MG tablet Take 400 mg by mouth daily. 02/05/22 02/05/23  [provider]  OVER THE COUNTER MEDICATION Heme Plus    [provider]  potassium chloride (MICRO-K) 10 MEQ CR capsule Take 10 mEq by mouth 2 (two) times daily. 2 tablets daily    [provider]      Allergies    Amlodipine, Amoxicillin-pot clavulanate, Ginger, and Sulfa antibiotics    Review of Systems   Review of Systems  Constitutional:  Negative for fever.  Respiratory:  Positive for cough.   Gastrointestinal:  Negative for diarrhea, nausea and vomiting.  Skin:  Positive for wound.    Physical Exam Updated Vital Signs BP 138/72   Pulse 68   Temp 98.3 F (36.8 C) (Oral)   Resp 18   SpO2 96%  Physical Exam Vitals and nursing note reviewed.  Constitutional:      General: She is not in acute distress.    Appearance: She is well-developed.  HENT:     Head: Normocephalic and atraumatic.  Eyes:     Conjunctiva/sclera: Conjunctivae normal.  Cardiovascular:     Rate and Rhythm: Normal rate and regular rhythm.  Heart sounds: No murmur heard. Pulmonary:     Effort: Pulmonary effort is normal. No respiratory distress.     Breath sounds: Normal breath sounds.     Comments: Diffuse crackles, with coughing with deep breath. Abdominal:     Palpations: Abdomen is soft.     Tenderness: There is no abdominal tenderness.  Musculoskeletal:        General: No swelling.     Cervical back: Neck supple.  Skin:    General: Skin is warm and dry.  Neurological:     General: No focal deficit present.     Mental Status: She is alert.  Psychiatric:        Mood  and Affect: Mood normal.     ED Results / Procedures / Treatments   Labs (all labs ordered are listed, but only abnormal results are displayed) Labs Reviewed  CBC WITH DIFFERENTIAL/PLATELET - Abnormal; Notable for the following components:      Result Value   RBC 3.84 (*)    MCV 103.1 (*)    MCH 35.4 (*)    All other components within normal limits  BASIC METABOLIC PANEL - Abnormal; Notable for the following components:   Glucose, Bld 148 (*)    All other components within normal limits    EKG None  Radiology CT ABDOMEN PELVIS W CONTRAST  Result Date: 12/20/2022 CLINICAL DATA:  Abdominal pain, acute, nonlocalized Umbilical wound x several days, redness, pain, cellulitis. History of polypectomy, tubal, cholecystectomy. EXAM: CT ABDOMEN AND PELVIS WITH CONTRAST TECHNIQUE: Multidetector CT imaging of the abdomen and pelvis was performed using the standard protocol following bolus administration of intravenous contrast. RADIATION DOSE REDUCTION: This exam was performed according to the departmental dose-optimization program which includes automated exposure control, adjustment of the mA and/or kV according to patient size and/or use of iterative reconstruction technique. CONTRAST:  OMNIPAQUE IOHEXOL 300 MG/ML  SOLN COMPARISON:  CT abdomen pelvis 10/29/2014 FINDINGS: Lower chest: Large hiatal hernia containing the majority of the stomach. Mitral annular calcification. Atherosclerotic plaque. Hepatobiliary: Subcentimeter hypodensities too small to characterize. Status post cholecystectomy. No biliary dilatation. Pancreas: No focal lesion. Normal pancreatic contour. No surrounding inflammatory changes. No main pancreatic ductal dilatation. Spleen: Normal in size without focal abnormality.  Splenule noted. Adrenals/Urinary Tract: No adrenal nodule bilaterally. Bilateral kidneys enhance symmetrically. Fluid dense lesion likely represents a simple renal cyst. Simple renal cysts, in the absence  of clinically indicated signs/symptoms, require no independent follow-up. No hydronephrosis. No hydroureter. The urinary bladder is unremarkable. On delayed imaging, there is no urothelial wall thickening and there are no filling defects in the opacified portions of the bilateral collecting systems or ureters. Stomach/Bowel: Stomach is within normal limits. No evidence of bowel wall thickening or dilatation. Colonic diverticulosis. The appendix is not definitely identified with no inflammatory changes in the right lower quadrant to suggest acute appendicitis. Vascular/Lymphatic: No abdominal aorta or iliac aneurysm. Mild atherosclerotic plaque of the aorta and its branches. No abdominal, pelvic, or inguinal lymphadenopathy. Reproductive: Uterus and bilateral adnexa are unremarkable. Other: No intraperitoneal free fluid. No intraperitoneal free gas. No organized fluid collection. Musculoskeletal: Superficial periumbilical dermal thickening. No organized fluid collection. No deep subcutaneus soft tissue edema or emphysema. No suspicious lytic or blastic osseous lesions. No acute displaced fracture. Multilevel degenerative changes of the spine. IMPRESSION: 1. Superficial periumbilical dermal thickening. No organized fluid collection. 2. Large hiatal hernia containing the majority of the stomach. 3. Colonic diverticulosis with no acute diverticulitis. Electronically Signed  By: Tish Frederickson M.D.   On: 12/20/2022 21:09   DG Chest 2 View  Result Date: 12/20/2022 CLINICAL DATA:  shortness of breath EXAM: CHEST - 2 VIEW COMPARISON:  None Available. FINDINGS: Cardiac silhouette is unremarkable. No pneumothorax or pleural effusion. Retrocardiac opacity consistent with hiatal hernia the lungs are clear. The visualized skeletal structures are unremarkable. IMPRESSION: No acute cardiopulmonary process.  Hiatal hernia. Electronically Signed   By: Layla Maw M.D.   On: 12/20/2022 18:39    Procedures Ultrasound ED  Soft Tissue  Date/Time: 12/20/2022 6:54 PM  Performed by: Dolphus Jenny, PA-C Authorized by: Dolphus Jenny, PA-C   Procedure details:    Indications: evaluate for cellulitis     Transverse view:  Visualized   Longitudinal view:  Visualized   Images: archived   Location:    Location: abdominal wall     Side:  Midline Findings:     abscess present    cellulitis present     Medications Ordered in ED Medications  cephALEXin (KEFLEX) capsule 500 mg (has no administration in time range)  oxyCODONE-acetaminophen (PERCOCET/ROXICET) 5-325 MG per tablet 1 tablet (has no administration in time range)  oxyCODONE-acetaminophen (PERCOCET/ROXICET) 5-325 MG per tablet 1 tablet (1 tablet Oral Given 12/20/22 1629)  iohexol (OMNIPAQUE) 300 MG/ML solution 100 mL (100 mLs Intravenous Contrast Given 12/20/22 2000)    ED Course/ Medical Decision Making/ A&P                                 Medical Decision Making Amount and/or Complexity of Data Reviewed Labs: ordered.  This patient presents to the ED with chief complaint(s) of umbilical wound with pertinent past medical history of  hypertension, IBS, anemia, influenza A infection (diagnosed 12/16/2022) which further complicates the presenting complaint. The complaint involves an extensive differential diagnosis and also carries with it a high risk of complications and morbidity.    The differential diagnosis includes abscess, cellulitis, Pneumonia  Additional history obtained: Records reviewed primary care progress Notes  ED Course and Reassessment: Patient given Percocet for pain Patient given first dose of Keflex and additional Percocet for pain.  Independent labs interpretation:  The following labs were independently interpreted:  CBC: Consistent with previous CBCs with decreased RBC, increased MCV increased MCH BMP: Notable findings  Independent visualization of imaging: - I independently visualized the following imaging with scope of  interpretation limited to determining acute life threatening conditions related to emergency care: Chest x-ray, which revealed no acute pulmonary process, hiatal hernia noted and CT abdomen pelvis with contrast which showed superficial periumbilical dermal thickening with no organized fluid collection, large hiatal hernia containing majority of the stomach, colonic diverticulosis with no acute diverticulitis.  Consultation: - Consulted or discussed management/test interpretation w/ external professional: None  Consideration for admission or further workup: Considered for admission or further workup however patient's vital signs are stable and patient feels comfortable going home.         Final Clinical Impression(s) / ED Diagnoses Final diagnoses:  Cellulitis of umbilicus    Rx / DC Orders ED Discharge Orders          Ordered    cephALEXin (KEFLEX) 500 MG capsule  2 times daily        12/20/22 2144    oxyCODONE-acetaminophen (PERCOCET/ROXICET) 5-325 MG tablet  Every 6 hours PRN        12/20/22 2144  Dolphus Jenny, PA-C 12/20/22 2150    Terald Sleeper, MD 12/20/22 [phone]

## 2022-12-20 NOTE — ED Provider Notes (Signed)
[facility]    CSN: [id number] Arrival date & time: 12/20/22  1225      History   Chief Complaint Chief Complaint  Patient presents with   Wound Check    Appt    HPI Arabela Basaldua is a 62 y.o. female.   HPI  Honor presents for wound check.  She has been having redness around her bellybutton for the past 4 days.  S  She was seen by her PCP and given antibiotics. Started her antibiotics on Tuesday.   There has been "a lot of drainage" since this morning. Notes her abdomen is more swollen than before.  The redness has not progressed much but the pain has. It is very tender to touch. No fever.  he recently came back from [place] cruise were several of her cruise mates have been sick with COVID and influenza.    Past Medical History:  Diagnosis Date   Diverticulosis    GERD (gastroesophageal reflux disease)    Hyperglycemia    Hypertension    IBS (irritable bowel syndrome)    Mitral valve prolapse    followed by PCP   Psoriasis    Wears contact lenses     Patient Active Problem List   Diagnosis Date Noted   History of colonic polyps    Polyp of ascending colon    Pedal edema 09/05/2019   SOB (shortness of breath) on exertion 09/05/2019   Cecal polyp    Diverticulosis of colon with hemorrhage    Symptomatic anemia    Iron deficiency anemia due to chronic blood loss    Columnar epithelial-lined lower esophagus    Hiatal hernia    Severe anemia 02/08/2018   Diarrhea    Diverticulitis of large intestine without perforation or abscess without bleeding    Benign neoplasm of ascending colon    Benign neoplasm of transverse colon    Diverticulitis 11/01/2014   HTN (hypertension) 11/01/2014   IBS (irritable bowel syndrome) 11/01/2014   Hyperglycemia 11/01/2014   Enthesopathy of hip 08/04/2013   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   Cervical high risk human papillomavirus (HPV) DNA test positive 08/01/2013   Can't get food down 08/20/2011   Benign essential  hypertension 05/14/2011    Past Surgical History:  Procedure Laterality Date   CHOLECYSTECTOMY     COLONOSCOPY Left 02/10/2018   Procedure: COLONOSCOPY;  Surgeon: Pasty Spillers, MD;  Location: [facility] ENDOSCOPY;  Service: Endoscopy;  Laterality: Left;   COLONOSCOPY WITH PROPOFOL N/A 01/05/2015   Procedure: COLONOSCOPY WITH PROPOFOL;  Surgeon: Midge Minium, MD;  Location: [facility];  Service: Endoscopy;  Laterality: N/A;   COLONOSCOPY WITH PROPOFOL N/A 02/03/2020   Procedure: COLONOSCOPY WITH BIOPSY;  Surgeon: Midge Minium, MD;  Location: [facility];  Service: Endoscopy;  Laterality: N/A;   ESOPHAGOGASTRODUODENOSCOPY N/A 02/09/2018   Procedure: ESOPHAGOGASTRODUODENOSCOPY (EGD);  Surgeon: Pasty Spillers, MD;  Location: [facility] ENDOSCOPY;  Service: Endoscopy;  Laterality: N/A;   LAPAROSCOPIC TUBAL LIGATION     POLYPECTOMY  01/05/2015   Procedure: POLYPECTOMY;  Surgeon: Midge Minium, MD;  Location: [facility];  Service: Endoscopy;;   POLYPECTOMY N/A 02/03/2020   Procedure: POLYPECTOMY;  Surgeon: Midge Minium, MD;  Location: [facility];  Service: Endoscopy;  Laterality: N/A;    OB History   No obstetric history on file.      Home Medications    Prior to Admission medications   Medication Sig Start Date End Date Taking? Authorizing Provider  albuterol (  VENTOLIN HFA) 108 (90 Base) MCG/ACT inhaler Inhale into the lungs. 09/12/20 12/17/23 Yes [provider]  carvedilol (COREG) 25 MG tablet Take 25 mg by mouth 2 (two) times daily.   Yes [provider]  cholecalciferol (VITAMIN D3) 25 MCG (1000 UNIT) tablet Take 1,000 Units by mouth daily.   Yes [provider]  doxycycline (VIBRAMYCIN) 100 MG capsule Take 1 capsule by mouth 2 (two) times daily. 12/17/22 12/24/22 Yes [provider]  DUPIXENT 300 MG/2ML SOPN Inject 300 mg into the skin every 14 (fourteen) days.   Yes [provider]  magnesium oxide (MAG-OX)  400 MG tablet Take 400 mg by mouth daily. 02/05/22 02/05/23 Yes [provider]  OVER THE COUNTER MEDICATION Heme Plus   Yes [provider]  potassium chloride (MICRO-K) 10 MEQ CR capsule Take 10 mEq by mouth 2 (two) times daily. 2 tablets daily   Yes [provider]  ferrous gluconate (FERGON) 324 MG tablet Take 324 mg by mouth daily with breakfast. Patient not taking: Reported on 08/19/2022 02/05/22 02/05/23  [provider]  hydrOXYzine (ATARAX) 25 MG tablet Take 25 mg by mouth 3 (three) times daily. Patient not taking: Reported on 08/19/2022 05/28/22   [provider]  losartan-hydrochlorothiazide (HYZAAR) 100-12.5 MG tablet Take 1 tablet by mouth daily.    [provider]    Family History Family History  Problem Relation Age of Onset   Irritable bowel syndrome Sister    Hypertension Mother    Diabetes Mother    Hypertension Father    Alcohol abuse Father    COPD Brother     Social History Social History   Tobacco Use   Smoking status: Former    Current packs/day: 0.00    Types: Cigarettes    Quit date: 2006    Years since quitting: 18.7   Smokeless tobacco: Never   Tobacco comments:       Vaping Use   Vaping status: Never Used  Substance Use Topics   Alcohol use: Yes    Alcohol/week: 3.0 standard drinks of alcohol    Types: 1 Glasses of wine, 1 Cans of beer, 1 Shots of liquor per week    Comment: occassional - 4 drinks a week   Drug use: No     Allergies   Amlodipine, Amoxicillin-pot clavulanate, Ginger, and Sulfa antibiotics   Review of Systems Review of Systems :negative unless otherwise stated in HPI.      Physical Exam Triage Vital Signs ED Triage Vitals  Encounter Vitals Group     BP      Systolic BP Percentile      Diastolic BP Percentile      Pulse      Resp      Temp      Temp src      SpO2      Weight      Height      Head Circumference      Peak Flow      Pain Score      Pain Loc       Pain Education      Exclude from Growth Chart    No data found.  Updated Vital Signs BP (!) 156/85 (BP Location: Left Arm)   Pulse 68   Temp 98.4 F (36.9 C) (Oral)   Resp 16   Ht 5\' 10"  (1.778 m)   Wt 110.2 kg   SpO2 99%   BMI 34.87 kg/m  Visual Acuity Right Eye Distance:   Left Eye Distance:   Bilateral Distance:    Right Eye Near:   Left Eye Near:    Bilateral Near:     Physical Exam  GEN: alert, uncomfortable appearing female RESP: no increased work of breathing ABD: soft, non-tender, TTP around umbilicus, thick purulent discharge expressed with light palpation, patient not tolerating manipulation of the area SKIN: warm and dry; posterior erythema with likely umbilical abscess     UC Treatments / Results  Labs (all labs ordered are listed, but only abnormal results are displayed) Labs Reviewed - No data to display  EKG   Radiology No results found.  Procedures Procedures (including critical care time)  Medications Ordered in UC Medications - No data to display  Initial Impression / Assessment and Plan / UC Course  I have reviewed the triage vital signs and the nursing notes.  Pertinent labs & imaging results that were available during my care of the patient were reviewed by me and considered in my medical decision making (see chart for details).     Patient is a 62 y.o. femalewho presents for wound check.  Overall, patient is well-appearing and well-hydrated.  Vital signs stable.  Nicle is afebrile.  On chart review, she was seen on 12/17/2022 by her PCP at [facility] for cellulitis of the umbilicus and given doxycycline for 7 days of which she is taken 4 days ago.  This morning, she started having purulent discharge and increasing pain from her umbilicus.  Notes that she has had 2 abdominal surgeries in the past.  Unfortunately, I do not feel comfortable opening this area as I do not know the location of bowel and how deep it actually is.     Recommended ED evaluation for ultrasound and possible CT if needed.  She may benefit from IV pain control. Husband updated at beside and will drive her to [facility] ED for further evaluation as they do not want to wait all day in the hospital.   Discussed MDM, treatment plan and plan for follow-up with patient and her husband who agree with plan.     Final Clinical Impressions(s) / UC Diagnoses   Final diagnoses:  Abscess, umbilical  Periumbilical abdominal pain     Discharge Instructions      You have been advised to follow up immediately in the emergency department for concerning signs or symptoms as discussed during your visit. If you declined EMS transport, please have a family member take you directly to the ED at this time. Do not delay.   Based on concerns about condition, if you do not follow up in the ED, you may risk poor outcomes including worsening of condition, delayed treatment and potentially life threatening issues. If you have declined to go to the ED at this time, you should call your PCP immediately to set up a follow up appointment.   Go to ED for red flag symptoms, including; fevers you cannot reduce with Tylenol/Motrin, severe headaches, vision changes, numbness/weakness in part of the body, lethargy, confusion, intractable vomiting, severe dehydration, chest pain, breathing difficulty, severe persistent abdominal or pelvic pain, signs of severe infection (increased redness, swelling of an area), feeling faint or passing out, dizziness, etc. You should especially go to the ED for sudden acute worsening of condition if you do not elect to go at this time.       ED Prescriptions   None    PDMP not reviewed this encounter.  Katha Cabal, DO 12/20/22 1313

## 2022-12-20 NOTE — Discharge Instructions (Signed)
Today retreated for cellulitis.  Please follow up with your PCP as needed.  Please pick up your antibiotics from the pharmacy and take as instructed. Thank you for letting us treat you today. After reviewing your labs and imaging, I feel you are safe to go home. Please follow up with your PCP in the next several days and provide them with your records from this visit. Return to the Emergency Room if pain becomes severe or symptoms worsen.

## 2022-12-20 NOTE — Discharge Instructions (Addendum)

## 2023-05-18 ENCOUNTER — Telehealth: Payer: Self-pay | Admitting: Gastroenterology

## 2023-05-18 NOTE — Telephone Encounter (Signed)
 Left message on voicemail.

## 2023-05-18 NOTE — Telephone Encounter (Signed)
 I spoke to pt... she had questions regarding scheduling appt... Pt advised that Dr Servando Snare no longer is seeing patients in the office and the office is currently looking for new providers for the practice... Pt expressed understanding and stated she would give [place] a call in the next couple of months to check

## 2023-05-18 NOTE — Telephone Encounter (Signed)
 Pt requesting call back with medication question

## 2023-05-20 ENCOUNTER — Inpatient Hospital Stay: Payer: Self-pay

## 2023-05-21 ENCOUNTER — Inpatient Hospital Stay: Payer: Self-pay | Admitting: Oncology

## 2023-05-21 ENCOUNTER — Inpatient Hospital Stay: Payer: Self-pay

## 2023-05-28 ENCOUNTER — Encounter: Payer: Self-pay | Admitting: Oncology

## 2023-06-01 ENCOUNTER — Other Ambulatory Visit: Payer: Self-pay | Admitting: *Deleted

## 2023-06-01 DIAGNOSIS — D500 Iron deficiency anemia secondary to blood loss (chronic): Secondary | ICD-10-CM

## 2023-06-02 ENCOUNTER — Encounter: Payer: Self-pay | Admitting: Oncology

## 2023-06-02 ENCOUNTER — Inpatient Hospital Stay: Attending: Oncology

## 2023-06-02 DIAGNOSIS — Z87891 Personal history of nicotine dependence: Secondary | ICD-10-CM | POA: Diagnosis not present

## 2023-06-02 DIAGNOSIS — Z79899 Other long term (current) drug therapy: Secondary | ICD-10-CM | POA: Diagnosis not present

## 2023-06-02 DIAGNOSIS — D500 Iron deficiency anemia secondary to blood loss (chronic): Secondary | ICD-10-CM

## 2023-06-02 DIAGNOSIS — D509 Iron deficiency anemia, unspecified: Secondary | ICD-10-CM | POA: Diagnosis present

## 2023-06-02 LAB — CBC WITH DIFFERENTIAL/PLATELET
Abs Immature Granulocytes: 0.01 10*3/uL (ref 0.00–0.07)
Basophils Absolute: 0 10*3/uL (ref 0.0–0.1)
Basophils Relative: 1 %
Eosinophils Absolute: 0.1 10*3/uL (ref 0.0–0.5)
Eosinophils Relative: 3 %
HCT: 38.6 % (ref 36.0–46.0)
Hemoglobin: 12 g/dL (ref 12.0–15.0)
Immature Granulocytes: 0 %
Lymphocytes Relative: 18 %
Lymphs Abs: 0.7 10*3/uL (ref 0.7–4.0)
MCH: 32.2 pg (ref 26.0–34.0)
MCHC: 31.1 g/dL (ref 30.0–36.0)
MCV: 103.5 fL — ABNORMAL HIGH (ref 80.0–100.0)
Monocytes Absolute: 0.5 10*3/uL (ref 0.1–1.0)
Monocytes Relative: 12 %
Neutro Abs: 2.7 10*3/uL (ref 1.7–7.7)
Neutrophils Relative %: 66 %
Platelets: 188 10*3/uL (ref 150–400)
RBC: 3.73 MIL/uL — ABNORMAL LOW (ref 3.87–5.11)
RDW: 15.4 % (ref 11.5–15.5)
WBC: 4.1 10*3/uL (ref 4.0–10.5)
nRBC: 0 % (ref 0.0–0.2)

## 2023-06-02 LAB — IRON AND TIBC
Iron: 45 ug/dL (ref 28–170)
Saturation Ratios: 10 % — ABNORMAL LOW (ref 10.4–31.8)
TIBC: 440 ug/dL (ref 250–450)
UIBC: 395 ug/dL

## 2023-06-02 LAB — FERRITIN: Ferritin: 36 ng/mL (ref 11–307)

## 2023-06-03 ENCOUNTER — Telehealth: Payer: Self-pay

## 2023-06-03 ENCOUNTER — Inpatient Hospital Stay

## 2023-06-03 ENCOUNTER — Inpatient Hospital Stay: Admitting: Oncology

## 2023-06-03 VITALS — BP 133/81 | HR 74 | Temp 97.9°F | Resp 18 | Ht 70.0 in | Wt 249.9 lb

## 2023-06-03 DIAGNOSIS — K580 Irritable bowel syndrome with diarrhea: Secondary | ICD-10-CM

## 2023-06-03 DIAGNOSIS — D509 Iron deficiency anemia, unspecified: Secondary | ICD-10-CM | POA: Diagnosis not present

## 2023-06-03 NOTE — Progress Notes (Signed)
 [facility]  Telephone:(336) [phone] Fax:(336) [phone]  ID: Tina Hurst OB: Jul 14, 1960  MR#: [id number]  OZD#:[id number]  Patient Care Team: [facility] as PCP - General  CHIEF COMPLAINT: Iron deficiency anemia.  INTERVAL HISTORY: Patient returns to clinic today for repeat laboratory, further evaluation, and consideration of additional IV Venofer.  She currently feels well and is asymptomatic.  She does not complain of any weakness or fatigue.  She has no neurologic complaints.  She denies any recent fevers or illnesses.  She has a good appetite and denies weight loss.  She has no chest pain, shortness of breath, cough, or hemoptysis.  She denies any nausea, vomiting, constipation, or diarrhea.  She has no melena or hematochezia.  She has no urinary complaints.  Patient offers no specific complaints today.  REVIEW OF SYSTEMS:   Review of Systems  Constitutional: Negative.  Negative for fever, malaise/fatigue and weight loss.  Respiratory: Negative.  Negative for cough, hemoptysis and shortness of breath.   Cardiovascular: Negative.  Negative for chest pain and leg swelling.  Gastrointestinal: Negative.  Negative for abdominal pain, blood in stool and melena.  Genitourinary: Negative.  Negative for hematuria.  Musculoskeletal: Negative.  Negative for back pain.  Skin: Negative.  Negative for rash.  Neurological: Negative.  Negative for dizziness, focal weakness, weakness and headaches.  Psychiatric/Behavioral: Negative.  The patient is not nervous/anxious.     As per HPI. Otherwise, a complete review of systems is negative.  PAST MEDICAL HISTORY: Past Medical History:  Diagnosis Date   Diverticulosis    GERD (gastroesophageal reflux disease)    Hyperglycemia    Hypertension    IBS (irritable bowel syndrome)    Mitral valve prolapse    followed by PCP   Psoriasis    Wears contact lenses     PAST SURGICAL HISTORY: Past Surgical History:   Procedure Laterality Date   CHOLECYSTECTOMY     COLONOSCOPY Left 02/10/2018   Procedure: COLONOSCOPY;  Surgeon: Pasty Spillers, MD;  Location: [facility] ENDOSCOPY;  Service: Endoscopy;  Laterality: Left;   COLONOSCOPY WITH PROPOFOL N/A 01/05/2015   Procedure: COLONOSCOPY WITH PROPOFOL;  Surgeon: Midge Minium, MD;  Location: [facility];  Service: Endoscopy;  Laterality: N/A;   COLONOSCOPY WITH PROPOFOL N/A 02/03/2020   Procedure: COLONOSCOPY WITH BIOPSY;  Surgeon: Midge Minium, MD;  Location: [facility];  Service: Endoscopy;  Laterality: N/A;   ESOPHAGOGASTRODUODENOSCOPY N/A 02/09/2018   Procedure: ESOPHAGOGASTRODUODENOSCOPY (EGD);  Surgeon: Pasty Spillers, MD;  Location: [facility] ENDOSCOPY;  Service: Endoscopy;  Laterality: N/A;   LAPAROSCOPIC TUBAL LIGATION     POLYPECTOMY  01/05/2015   Procedure: POLYPECTOMY;  Surgeon: Midge Minium, MD;  Location: [facility];  Service: Endoscopy;;   POLYPECTOMY N/A 02/03/2020   Procedure: POLYPECTOMY;  Surgeon: Midge Minium, MD;  Location: [facility];  Service: Endoscopy;  Laterality: N/A;    FAMILY HISTORY: Family History  Problem Relation Age of Onset   Irritable bowel syndrome Sister    Hypertension Mother    Diabetes Mother    Hypertension Father    Alcohol abuse Father    COPD Brother     ADVANCED DIRECTIVES (Y/N):  N  HEALTH MAINTENANCE: Social History   Tobacco Use   Smoking status: Former    Current packs/day: 0.00    Types: Cigarettes    Quit date: 2006    Years since quitting: 19.2   Smokeless tobacco: Never   Tobacco comments:  Vaping Use   Vaping status: Never Used  Substance Use Topics   Alcohol use: Yes    Alcohol/week: 3.0 standard drinks of alcohol    Types: 1 Glasses of wine, 1 Cans of beer, 1 Shots of liquor per week    Comment: occassional - 4 drinks a week   Drug use: No     Colonoscopy:  PAP:  Bone density:  Lipid panel:  Allergies  Allergen Reactions    Amlodipine Palpitations, Swelling and Other (See Comments)    Other Reaction: SWOLLEN FEET   Amoxicillin-Pot Clavulanate Nausea And Vomiting and Other (See Comments)    Pt states that she is able to take 250mg  tablet.  Other Reaction: GI UPSET - if take >500 mg (can take 250 mg)   Ginger Rash   Sulfa Antibiotics Rash    Current Outpatient Medications  Medication Sig Dispense Refill   Banana Flakes (BANATROL PO) Take by mouth.     carvedilol (COREG) 25 MG tablet Take 25 mg by mouth 2 (two) times daily.     DUPIXENT 300 MG/2ML SOPN Inject 300 mg into the skin every 14 (fourteen) days.     losartan-hydrochlorothiazide (HYZAAR) 100-12.5 MG tablet Take 1 tablet by mouth daily.     OVER THE COUNTER MEDICATION Heme Plus     potassium chloride (MICRO-K) 10 MEQ CR capsule Take 10 mEq by mouth 2 (two) times daily. 2 tablets daily     No current facility-administered medications for this visit.    OBJECTIVE: Vitals:   06/03/23 1316  BP: 133/81  Pulse: 74  Resp: 18  Temp: 97.9 F (36.6 C)  SpO2: 100%      Body mass index is 35.86 kg/m.    ECOG FS:0 - Asymptomatic  General: Well-developed, well-nourished, no acute distress. Eyes: Pink conjunctiva, anicteric sclera. HEENT: Normocephalic, moist mucous membranes. Lungs: No audible wheezing or coughing. Heart: Regular rate and rhythm. Abdomen: Soft, nontender, no obvious distention. Musculoskeletal: No edema, cyanosis, or clubbing. Neuro: Alert, answering all questions appropriately. Cranial nerves grossly intact. Skin: No rashes or petechiae noted. Psych: Normal affect.  LAB RESULTS:  Lab Results  Component Value Date   NA 137 12/20/2022   K 3.6 12/20/2022   CL 101 12/20/2022   CO2 24 12/20/2022   GLUCOSE 148 (H) 12/20/2022   BUN 12 12/20/2022   CREATININE 0.79 12/20/2022   CALCIUM 9.1 12/20/2022   PROT 7.8 11/01/2014   ALBUMIN 4.5 11/01/2014   AST 51 (H) 11/01/2014   ALT 42 11/01/2014   ALKPHOS 106 11/01/2014   BILITOT  0.5 11/01/2014   GFRNONAA >60 12/20/2022   GFRAA >60 02/09/2018    Lab Results  Component Value Date   WBC 4.1 06/02/2023   NEUTROABS 2.7 06/02/2023   HGB 12.0 06/02/2023   HCT 38.6 06/02/2023   MCV 103.5 (H) 06/02/2023   PLT 188 06/02/2023   Lab Results  Component Value Date   IRON 45 06/02/2023   TIBC 440 06/02/2023   IRONPCTSAT 10 (L) 06/02/2023   Lab Results  Component Value Date   FERRITIN 36 06/02/2023     STUDIES: No results found.  ASSESSMENT: Iron deficiency anemia.  PLAN:    Iron deficiency anemia: Patient had full GI work-up over 4 years ago that did not reveal any distinct etiology.  Repeat colonoscopy November 2021 revealed 2 polyps, but no other significant pathology.  Patient has a mildly reduced iron saturation of 10%, but the remainder of her iron panel and hemoglobin are  within normal limits.  She does not require additional IV Venofer today.  She last received treatment on June 27, 2022.  Return to clinic in 6 months with repeat laboratory, further evaluation, and consideration of additional treatment if needed.   IBS: Patient was given a referral to [facility] since her previous GI doctor is no longer seeing patients in clinic.  I spent a total of 20 minutes reviewing chart data, face-to-face evaluation with the patient, counseling and coordination of care as detailed above.   Patient expressed understanding and was in agreement with this plan. She also understands that She can call clinic at any time with any questions, concerns, or complaints.     Jeralyn Ruths, MD   06/03/2023 1:46 PM

## 2023-06-03 NOTE — Telephone Encounter (Signed)
Referral faxed to [facility] 

## 2023-12-04 ENCOUNTER — Other Ambulatory Visit: Payer: Self-pay | Admitting: *Deleted

## 2023-12-04 DIAGNOSIS — D500 Iron deficiency anemia secondary to blood loss (chronic): Secondary | ICD-10-CM

## 2023-12-07 ENCOUNTER — Inpatient Hospital Stay: Attending: Oncology

## 2023-12-07 DIAGNOSIS — D509 Iron deficiency anemia, unspecified: Secondary | ICD-10-CM | POA: Insufficient documentation

## 2023-12-07 DIAGNOSIS — D500 Iron deficiency anemia secondary to blood loss (chronic): Secondary | ICD-10-CM

## 2023-12-07 LAB — IRON AND TIBC
Iron: 61 ug/dL (ref 28–170)
Saturation Ratios: 13 % (ref 10.4–31.8)
TIBC: 463 ug/dL — ABNORMAL HIGH (ref 250–450)
UIBC: 402 ug/dL

## 2023-12-07 LAB — CBC WITH DIFFERENTIAL/PLATELET
Abs Immature Granulocytes: 0.01 K/uL (ref 0.00–0.07)
Basophils Absolute: 0 K/uL (ref 0.0–0.1)
Basophils Relative: 1 %
Eosinophils Absolute: 0.1 K/uL (ref 0.0–0.5)
Eosinophils Relative: 2 %
HCT: 34.9 % — ABNORMAL LOW (ref 36.0–46.0)
Hemoglobin: 11.6 g/dL — ABNORMAL LOW (ref 12.0–15.0)
Immature Granulocytes: 0 %
Lymphocytes Relative: 17 %
Lymphs Abs: 0.8 K/uL (ref 0.7–4.0)
MCH: 35 pg — ABNORMAL HIGH (ref 26.0–34.0)
MCHC: 33.2 g/dL (ref 30.0–36.0)
MCV: 105.4 fL — ABNORMAL HIGH (ref 80.0–100.0)
Monocytes Absolute: 0.7 K/uL (ref 0.1–1.0)
Monocytes Relative: 13 %
Neutro Abs: 3.4 K/uL (ref 1.7–7.7)
Neutrophils Relative %: 67 %
Platelets: 212 K/uL (ref 150–400)
RBC: 3.31 MIL/uL — ABNORMAL LOW (ref 3.87–5.11)
RDW: 13.2 % (ref 11.5–15.5)
WBC: 5 K/uL (ref 4.0–10.5)
nRBC: 0 % (ref 0.0–0.2)

## 2023-12-07 LAB — FERRITIN: Ferritin: 33 ng/mL (ref 11–307)

## 2023-12-08 ENCOUNTER — Inpatient Hospital Stay: Admitting: Oncology

## 2023-12-08 ENCOUNTER — Inpatient Hospital Stay

## 2023-12-08 ENCOUNTER — Encounter: Payer: Self-pay | Admitting: Oncology

## 2023-12-08 VITALS — BP 111/76 | HR 80 | Temp 97.6°F | Resp 16 | Wt 245.0 lb

## 2023-12-08 DIAGNOSIS — D500 Iron deficiency anemia secondary to blood loss (chronic): Secondary | ICD-10-CM

## 2023-12-08 DIAGNOSIS — D509 Iron deficiency anemia, unspecified: Secondary | ICD-10-CM | POA: Diagnosis not present

## 2023-12-08 NOTE — Progress Notes (Signed)
 [facility]  Telephone:(336) [phone] [phone]) [phone]  ID: Tina Hurst OB: 06-Jan-1961  MR#: [id number]  RDW#:[id number]  Patient Care Team: [facility] as PCP - General  CHIEF COMPLAINT: Iron  deficiency anemia.  INTERVAL HISTORY: Patient returns to clinic today for repeat laboratory, further evaluation, and consideration of additional IV Venofer .  She continues to feel well and remains asymptomatic.  She does not complain of any weakness or fatigue.  She has no neurologic complaints.  She denies any recent fevers or illnesses.  She has a good appetite and denies weight loss.  She has no chest pain, shortness of breath, cough, or hemoptysis.  She denies any nausea, vomiting, constipation, or diarrhea.  She has no melena or hematochezia.  She has no urinary complaints.  Patient offers no specific complaints today.  REVIEW OF SYSTEMS:   Review of Systems  Constitutional: Negative.  Negative for fever, malaise/fatigue and weight loss.  Respiratory: Negative.  Negative for cough, hemoptysis and shortness of breath.   Cardiovascular: Negative.  Negative for chest pain and leg swelling.  Gastrointestinal: Negative.  Negative for abdominal pain, blood in stool and melena.  Genitourinary: Negative.  Negative for hematuria.  Musculoskeletal: Negative.  Negative for back pain.  Skin: Negative.  Negative for rash.  Neurological: Negative.  Negative for dizziness, focal weakness, weakness and headaches.  Psychiatric/Behavioral: Negative.  The patient is not nervous/anxious.     As per HPI. Otherwise, a complete review of systems is negative.  PAST MEDICAL HISTORY: Past Medical History:  Diagnosis Date   Diverticulosis    GERD (gastroesophageal reflux disease)    Hyperglycemia    Hypertension    IBS (irritable bowel syndrome)    Mitral valve prolapse    followed by PCP   Psoriasis    Wears contact lenses     PAST SURGICAL HISTORY: Past Surgical  History:  Procedure Laterality Date   CHOLECYSTECTOMY     COLONOSCOPY Left 02/10/2018   Procedure: COLONOSCOPY;  Surgeon: Janalyn Keene NOVAK, MD;  Location: [facility] ENDOSCOPY;  Service: Endoscopy;  Laterality: Left;   COLONOSCOPY WITH PROPOFOL  N/A 01/05/2015   Procedure: COLONOSCOPY WITH PROPOFOL ;  Surgeon: Rogelia Copping, MD;  Location: [facility];  Service: Endoscopy;  Laterality: N/A;   COLONOSCOPY WITH PROPOFOL  N/A 02/03/2020   Procedure: COLONOSCOPY WITH BIOPSY;  Surgeon: Copping Rogelia, MD;  Location: [facility];  Service: Endoscopy;  Laterality: N/A;   ESOPHAGOGASTRODUODENOSCOPY N/A 02/09/2018   Procedure: ESOPHAGOGASTRODUODENOSCOPY (EGD);  Surgeon: Janalyn Keene NOVAK, MD;  Location: [facility] ENDOSCOPY;  Service: Endoscopy;  Laterality: N/A;   LAPAROSCOPIC TUBAL LIGATION     POLYPECTOMY  01/05/2015   Procedure: POLYPECTOMY;  Surgeon: Rogelia Copping, MD;  Location: [facility];  Service: Endoscopy;;   POLYPECTOMY N/A 02/03/2020   Procedure: POLYPECTOMY;  Surgeon: Copping Rogelia, MD;  Location: [facility];  Service: Endoscopy;  Laterality: N/A;    FAMILY HISTORY: Family History  Problem Relation Age of Onset   Irritable bowel syndrome Sister    Hypertension Mother    Diabetes Mother    Hypertension Father    Alcohol abuse Father    COPD Brother     ADVANCED DIRECTIVES (Y/N):  N  HEALTH MAINTENANCE: Social History   Tobacco Use   Smoking status: Former    Current packs/day: 0.00    Types: Cigarettes    Quit date: 2006    Years since quitting: 19.7   Smokeless tobacco: Never   Tobacco comments:  Vaping Use   Vaping status: Never Used  Substance Use Topics   Alcohol use: Yes    Alcohol/week: 3.0 standard drinks of alcohol    Types: 1 Glasses of wine, 1 Cans of beer, 1 Shots of liquor per week    Comment: occassional - 4 drinks a week   Drug use: No     Colonoscopy:  PAP:  Bone density:  Lipid panel:  Allergies  Allergen Reactions    Amlodipine Palpitations, Swelling and Other (See Comments)    Other Reaction: SWOLLEN FEET   Amoxicillin-Pot Clavulanate Nausea And Vomiting and Other (See Comments)    Pt states that she is able to take 250mg  tablet.  Other Reaction: GI UPSET - if take >500 mg (can take 250 mg)   Ginger Rash   Sulfa Antibiotics Rash    Current Outpatient Medications  Medication Sig Dispense Refill   Banana Flakes (BANATROL PO) Take by mouth.     carvedilol  (COREG ) 25 MG tablet Take 25 mg by mouth 2 (two) times daily.     DUPIXENT 300 MG/2ML SOPN Inject 300 mg into the skin every 14 (fourteen) days.     losartan -hydrochlorothiazide (HYZAAR) 100-12.5 MG tablet Take 1 tablet by mouth daily.     OVER THE COUNTER MEDICATION Heme Plus     potassium chloride  (MICRO-K ) 10 MEQ CR capsule Take 10 mEq by mouth 2 (two) times daily. 2 tablets daily     No current facility-administered medications for this visit.    OBJECTIVE: Vitals:   12/08/23 1304  BP: 111/76  Pulse: 80  Resp: 16  Temp: 97.6 F (36.4 C)  SpO2: 99%      Body mass index is 35.15 kg/m.    ECOG FS:0 - Asymptomatic  General: Well-developed, well-nourished, no acute distress. Eyes: Pink conjunctiva, anicteric sclera. HEENT: Normocephalic, moist mucous membranes. Lungs: No audible wheezing or coughing. Heart: Regular rate and rhythm. Abdomen: Soft, nontender, no obvious distention. Musculoskeletal: No edema, cyanosis, or clubbing. Neuro: Alert, answering all questions appropriately. Cranial nerves grossly intact. Skin: No rashes or petechiae noted. Psych: Normal affect.  LAB RESULTS:  Lab Results  Component Value Date   NA 137 12/20/2022   K 3.6 12/20/2022   CL 101 12/20/2022   CO2 24 12/20/2022   GLUCOSE 148 (H) 12/20/2022   BUN 12 12/20/2022   CREATININE 0.79 12/20/2022   CALCIUM  9.1 12/20/2022   PROT 7.8 11/01/2014   ALBUMIN 4.5 11/01/2014   AST 51 (H) 11/01/2014   ALT 42 11/01/2014   ALKPHOS 106 11/01/2014    BILITOT 0.5 11/01/2014   GFRNONAA >60 12/20/2022   GFRAA >60 02/09/2018    Lab Results  Component Value Date   WBC 5.0 12/07/2023   NEUTROABS 3.4 12/07/2023   HGB 11.6 (L) 12/07/2023   HCT 34.9 (L) 12/07/2023   MCV 105.4 (H) 12/07/2023   PLT 212 12/07/2023   Lab Results  Component Value Date   IRON  61 12/07/2023   TIBC 463 (H) 12/07/2023   IRONPCTSAT 13 12/07/2023   Lab Results  Component Value Date   FERRITIN 33 12/07/2023     STUDIES: No results found.  ASSESSMENT: Iron  deficiency anemia.  PLAN:    Iron  deficiency anemia: Patient had full GI work-up over 4 years ago that did not reveal any distinct etiology.  Repeat colonoscopy November 2021 revealed 2 polyps, but no other significant pathology.  Patient's hemoglobin is only mildly decreased at 11.6, but the remainder of iron  panel and ferritin are  within normal limits.  She is also asymptomatic.  She does not require additional IV Venofer  today. She last received treatment on June 27, 2022.  After discussion with the patient, is agreed upon that no further follow-up is necessary.  Please continue to monitor iron  panel 1 or 2 times per year and refer patient back if there are any questions or concerns.   IBS: Patient reports her symptoms have resolved.  Continue follow-up with GI as scheduled.  I spent a total of 20 minutes reviewing chart data, face-to-face evaluation with the patient, counseling and coordination of care as detailed above.   Patient expressed understanding and was in agreement with this plan. She also understands that She can call clinic at any time with any questions, concerns, or complaints.     Evalene JINNY Reusing, MD   12/08/2023 1:13 PM
